# Patient Record
Sex: Male | Born: 1970
Health system: Southern US, Community
[De-identification: ages and names within clinical notes are randomized; demographics above are authoritative.]

## PROBLEM LIST (undated history)

## (undated) DIAGNOSIS — N3941 Urge incontinence: Secondary | ICD-10-CM

## (undated) DIAGNOSIS — G249 Dystonia, unspecified: Secondary | ICD-10-CM

## (undated) DIAGNOSIS — G244 Idiopathic orofacial dystonia: Secondary | ICD-10-CM

## (undated) DIAGNOSIS — R319 Hematuria, unspecified: Secondary | ICD-10-CM

## (undated) DIAGNOSIS — R7303 Prediabetes: Secondary | ICD-10-CM

## (undated) DIAGNOSIS — G4730 Sleep apnea, unspecified: Secondary | ICD-10-CM

## (undated) DIAGNOSIS — N401 Enlarged prostate with lower urinary tract symptoms: Secondary | ICD-10-CM

## (undated) DIAGNOSIS — I10 Essential (primary) hypertension: Secondary | ICD-10-CM

## (undated) DIAGNOSIS — Z973 Presence of spectacles and contact lenses: Secondary | ICD-10-CM

## (undated) DIAGNOSIS — R339 Retention of urine, unspecified: Secondary | ICD-10-CM

## (undated) DIAGNOSIS — T7840XA Allergy, unspecified, initial encounter: Secondary | ICD-10-CM

## (undated) DIAGNOSIS — M1990 Unspecified osteoarthritis, unspecified site: Secondary | ICD-10-CM

## (undated) DIAGNOSIS — J45909 Unspecified asthma, uncomplicated: Secondary | ICD-10-CM

## (undated) DIAGNOSIS — F411 Generalized anxiety disorder: Secondary | ICD-10-CM

## (undated) DIAGNOSIS — N35919 Unspecified urethral stricture, male, unspecified site: Secondary | ICD-10-CM

## (undated) HISTORY — DX: Sleep apnea, unspecified: G47.30

## (undated) HISTORY — PX: ACHILLES TENDON REPAIR: SUR1153

## (undated) HISTORY — PX: COLONOSCOPY: SHX174

## (undated) HISTORY — DX: Unspecified osteoarthritis, unspecified site: M19.90

## (undated) HISTORY — DX: Dystonia, unspecified: G24.9

## (undated) HISTORY — PX: OTHER SURGICAL HISTORY: SHX169

## (undated) HISTORY — DX: Allergy, unspecified, initial encounter: T78.40XA

---

## 1999-08-08 HISTORY — PX: ACHILLES TENDON REPAIR: SUR1153

## 2013-03-03 ENCOUNTER — Telehealth: Payer: Self-pay | Admitting: Family Medicine

## 2013-03-03 NOTE — Telephone Encounter (Addendum)
Pt would like to re-est with MD last visit 6 yrs ago. Pt is aware MD out of office

## 2013-03-14 NOTE — Telephone Encounter (Signed)
lmom for pt to sch appt with md in oct 2014

## 2013-03-14 NOTE — Telephone Encounter (Signed)
Okay to schedule per Dr Todd 

## 2013-03-18 NOTE — Telephone Encounter (Signed)
Pt returning call to follow-up on request to re-establish with Dr. Tawanna Cooler.  After reviewing chart, informed pt ok to schedule appt to re-establish, appt scheduled 05/27/13 @ 9am.

## 2013-05-27 ENCOUNTER — Encounter: Payer: Self-pay | Admitting: Family Medicine

## 2013-05-27 ENCOUNTER — Ambulatory Visit (INDEPENDENT_AMBULATORY_CARE_PROVIDER_SITE_OTHER): Payer: PRIVATE HEALTH INSURANCE | Admitting: Family Medicine

## 2013-05-27 VITALS — BP 130/90 | Temp 98.3°F | Ht 68.5 in | Wt 218.0 lb

## 2013-05-27 DIAGNOSIS — Z8042 Family history of malignant neoplasm of prostate: Secondary | ICD-10-CM

## 2013-05-27 DIAGNOSIS — Z23 Encounter for immunization: Secondary | ICD-10-CM

## 2013-05-27 DIAGNOSIS — R209 Unspecified disturbances of skin sensation: Secondary | ICD-10-CM

## 2013-05-27 DIAGNOSIS — Z2089 Contact with and (suspected) exposure to other communicable diseases: Secondary | ICD-10-CM

## 2013-05-27 DIAGNOSIS — Z202 Contact with and (suspected) exposure to infections with a predominantly sexual mode of transmission: Secondary | ICD-10-CM

## 2013-05-27 DIAGNOSIS — Z0000 Encounter for general adult medical examination without abnormal findings: Secondary | ICD-10-CM | POA: Insufficient documentation

## 2013-05-27 DIAGNOSIS — R200 Anesthesia of skin: Secondary | ICD-10-CM | POA: Insufficient documentation

## 2013-05-27 LAB — POCT URINALYSIS DIPSTICK
Bilirubin, UA: NEGATIVE
Glucose, UA: NEGATIVE
Ketones, UA: NEGATIVE
Nitrite, UA: NEGATIVE
Protein, UA: NEGATIVE
Spec Grav, UA: 1.025

## 2013-05-27 LAB — HEPATIC FUNCTION PANEL
ALT: 28 U/L (ref 0–53)
Albumin: 4.2 g/dL (ref 3.5–5.2)
Bilirubin, Direct: 0.1 mg/dL (ref 0.0–0.3)
Total Protein: 7.4 g/dL (ref 6.0–8.3)

## 2013-05-27 LAB — BASIC METABOLIC PANEL
CO2: 28 mEq/L (ref 19–32)
Creatinine, Ser: 0.9 mg/dL (ref 0.4–1.5)
GFR: 100.6 mL/min (ref >60.00)
Glucose, Bld: 90 mg/dL (ref 70–99)
Potassium: 4.1 mEq/L (ref 3.5–5.1)
Sodium: 138 mEq/L (ref 135–145)

## 2013-05-27 LAB — LIPID PANEL
Cholesterol: 210 mg/dL — ABNORMAL HIGH (ref 0–200)
HDL: 58.2 mg/dL (ref >39.00)
Total CHOL/HDL Ratio: 4
Triglycerides: 62 mg/dL (ref 0.0–149.0)
VLDL: 12.4 mg/dL (ref 0.0–40.0)

## 2013-05-27 LAB — CBC WITH DIFFERENTIAL/PLATELET
Basophils Relative: 0.4 % (ref 0.0–3.0)
Eosinophils Relative: 1.5 % (ref 0.0–5.0)
Hemoglobin: 15 g/dL (ref 13.0–17.0)
Lymphocytes Relative: 35.7 % (ref 12.0–46.0)
Monocytes Relative: 9.3 % (ref 3.0–12.0)
Neutro Abs: 2.4 10*3/uL (ref 1.4–7.7)
RBC: 4.79 Mil/uL (ref 4.22–5.81)
WBC: 4.5 10*3/uL (ref 4.5–10.5)

## 2013-05-27 MED ORDER — DOXYCYCLINE HYCLATE 100 MG PO TABS: 100.0000 mg | ORAL_TABLET | Freq: Two times a day (BID) | ORAL | Status: DC

## 2013-05-27 NOTE — Patient Instructions (Signed)
Doxycycline one twice daily till bilateral empty  We will call you when I gets her lab work back  Because of your family history prostate cancer or recommend yearly screening

## 2013-05-27 NOTE — Progress Notes (Signed)
  Subjective:    Patient ID: Sean Romero, male    DOB: [birth year], 42 y.o.   MRN: [id number]  HPI Sean Romero is a 42 year old single male nonsmoker who we last saw about 6 years ago.  He comes in today for general physical examination  He says he feels well except discuss some tingling in his right he in. It's been going on for about 3 months. No history,.  Also girlfriend was treated for Chlamydia and he was not given any medication. He's asymptomatic  No hospitalizations illnesses or injuries no med allergies does not taking medicines frank. Assessment: Drinks occasional drink of alcohol. Tetanus 2008 seasonal flu shot today  Family history noncontributory except father has a history of prostate cancer systems died at age 59. There is also a history of diabetes in his mother and his grandmother.   Review of Systems  Constitutional: Negative.   HENT: Negative.   Eyes: Negative.   Respiratory: Negative.   Cardiovascular: Negative.   Gastrointestinal: Negative.   Genitourinary: Negative.   Musculoskeletal: Negative.   Skin: Negative.   Neurological: Negative.   Psychiatric/Behavioral: Negative.   All other systems reviewed and are negative.       Objective:   Physical Exam  Constitutional: He is oriented to person, place, and time. He appears well-developed and well-nourished.  HENT:  Head: Normocephalic and atraumatic.  Right Ear: External ear normal.  Left Ear: External ear normal.  Nose: Nose normal.  Mouth/Throat: Oropharynx is clear and moist.  Eyes: Conjunctivae and EOM are normal. Pupils are equal, round, and reactive to light.  Neck: Normal range of motion. Neck supple. No JVD present. No tracheal deviation present. No thyromegaly present.  Cardiovascular: Normal rate, regular rhythm, normal heart sounds and intact distal pulses.  Exam reveals no gallop and no friction rub.   No murmur heard. Pulmonary/Chest: Effort normal and breath sounds normal. No stridor. No  respiratory distress. He has no wheezes. He has no rales. He exhibits no tenderness.  Abdominal: Soft. Bowel sounds are normal. He exhibits no distension and no mass. There is no tenderness. There is no rebound and no guarding.  Genitourinary: Rectum normal, prostate normal and penis normal. Guaiac negative stool. No penile tenderness.  Musculoskeletal: Normal range of motion. He exhibits no edema and no tenderness.  Lymphadenopathy:    He has no cervical adenopathy.  Neurological: He is alert and oriented to person, place, and time. He has normal reflexes. No cranial nerve deficit. He exhibits normal muscle tone.  Skin: Skin is warm and dry. No rash noted. No erythema. No pallor.  Psychiatric: He has a normal mood and affect. His behavior is normal. Judgment and thought content normal.          Assessment & Plan:  Healthy male  Early carpal tunnel syndrome right wrist....... splint  History of contact with chlamydia,,,,,,, doxycycline 100 twice a day for 10 days  Positive family history of prostate cancer......... screen yearly

## 2014-10-06 ENCOUNTER — Ambulatory Visit (INDEPENDENT_AMBULATORY_CARE_PROVIDER_SITE_OTHER): Payer: PRIVATE HEALTH INSURANCE | Admitting: Family Medicine

## 2014-10-06 ENCOUNTER — Encounter: Payer: Self-pay | Admitting: Family Medicine

## 2014-10-06 VITALS — BP 130/90 | Temp 98.6°F | Wt 224.0 lb

## 2014-10-06 DIAGNOSIS — R300 Dysuria: Secondary | ICD-10-CM | POA: Diagnosis not present

## 2014-10-06 DIAGNOSIS — R1030 Lower abdominal pain, unspecified: Secondary | ICD-10-CM | POA: Diagnosis not present

## 2014-10-06 DIAGNOSIS — Z8042 Family history of malignant neoplasm of prostate: Secondary | ICD-10-CM | POA: Diagnosis not present

## 2014-10-06 DIAGNOSIS — R1032 Left lower quadrant pain: Secondary | ICD-10-CM

## 2014-10-06 LAB — LIPID PANEL
Cholesterol: 212 mg/dL — ABNORMAL HIGH (ref 0–200)
HDL: 52 mg/dL (ref >39.00)
LDL CALC: 141 mg/dL — AB (ref 0–99)
NonHDL: 160
TRIGLYCERIDES: 96 mg/dL (ref 0.0–149.0)
Total CHOL/HDL Ratio: 4
VLDL: 19.2 mg/dL (ref 0.0–40.0)

## 2014-10-06 LAB — POCT URINALYSIS DIPSTICK
Bilirubin, UA: NEGATIVE
Blood, UA: NEGATIVE
GLUCOSE UA: NEGATIVE
KETONES UA: NEGATIVE
LEUKOCYTES UA: NEGATIVE
Nitrite, UA: NEGATIVE
PROTEIN UA: NEGATIVE
Urobilinogen, UA: NEGATIVE
pH, UA: 6

## 2014-10-06 LAB — CBC WITH DIFFERENTIAL/PLATELET
Basophils Absolute: 0 10*3/uL (ref 0.0–0.1)
Basophils Relative: 0.3 % (ref 0.0–3.0)
EOS ABS: 0 10*3/uL (ref 0.0–0.7)
Eosinophils Relative: 0.7 % (ref 0.0–5.0)
HCT: 44.4 % (ref 39.0–52.0)
Hemoglobin: 15 g/dL (ref 13.0–17.0)
LYMPHS PCT: 26.4 % (ref 12.0–46.0)
Lymphs Abs: 2 10*3/uL (ref 0.7–4.0)
MCHC: 33.7 g/dL (ref 30.0–36.0)
MCV: 91.7 fl (ref 78.0–100.0)
Monocytes Absolute: 0.4 10*3/uL (ref 0.1–1.0)
Monocytes Relative: 6 % (ref 3.0–12.0)
NEUTROS PCT: 66.6 % (ref 43.0–77.0)
Neutro Abs: 4.9 10*3/uL (ref 1.4–7.7)
PLATELETS: 231 10*3/uL (ref 150.0–400.0)
RBC: 4.85 Mil/uL (ref 4.22–5.81)
RDW: 14 % (ref 11.5–15.5)
WBC: 7.4 10*3/uL (ref 4.0–10.5)

## 2014-10-06 LAB — HEPATIC FUNCTION PANEL
ALT: 20 U/L (ref 0–53)
AST: 14 U/L (ref 0–37)
Albumin: 4.4 g/dL (ref 3.5–5.2)
Alkaline Phosphatase: 56 U/L (ref 39–117)
BILIRUBIN TOTAL: 0.7 mg/dL (ref 0.2–1.2)
Bilirubin, Direct: 0.1 mg/dL (ref 0.0–0.3)
Total Protein: 7.1 g/dL (ref 6.0–8.3)

## 2014-10-06 LAB — BASIC METABOLIC PANEL
BUN: 13 mg/dL (ref 6–23)
CO2: 31 meq/L (ref 19–32)
Calcium: 9.7 mg/dL (ref 8.4–10.5)
Chloride: 103 mEq/L (ref 96–112)
Creatinine, Ser: 0.96 mg/dL (ref 0.40–1.50)
GFR: 91.56 mL/min (ref >60.00)
GLUCOSE: 97 mg/dL (ref 70–99)
POTASSIUM: 3.9 meq/L (ref 3.5–5.1)
SODIUM: 138 meq/L (ref 135–145)

## 2014-10-06 LAB — TSH: TSH: 2.01 u[IU]/mL (ref 0.35–4.50)

## 2014-10-06 LAB — PSA: PSA: 0.71 ng/mL (ref 0.10–4.00)

## 2014-10-06 MED ORDER — DOXYCYCLINE HYCLATE 100 MG PO TABS: 100.0000 mg | ORAL_TABLET | Freq: Two times a day (BID) | ORAL | Status: DC

## 2014-10-06 NOTE — Patient Instructions (Signed)
Doxycycline 100 mg.....Marland Kitchen. one twice daily for 3 weeks  Labs today  Set up a 30 minute appointment mid to late April for general physical exam

## 2014-10-06 NOTE — Progress Notes (Signed)
Pre visit review using our clinic review tool, if applicable. No additional management support is needed unless otherwise documented below in the visit note. 

## 2014-10-06 NOTE — Progress Notes (Signed)
   Subjective:    Patient ID: Sean Romero, male    DOB: 05/14/1971, 44 y.o.   MRN: [id number]  HPI Sean Romero is a 44 year old married male nonsmoker who comes in today for evaluation of 3 issues  We saw him in the fall 2014. At that time his girlfriend had had chlamydia and he had not been treated. We treated with doxycycline 100 mg twice a day for 2 weeks. He said he has some burning on urination at the time which is still present. He has no fever chills or back pain.  For 2 weeks he said some soreness in his left groin. No fever chills vomiting diarrhea etc.  Also positive family history of prostate cancer recommended yearly screening. Last PSA 2014   Review of Systems Review of systems otherwise negative    Objective:   Physical Exam  Well-developed well-nourished male no acute distress vital signs stable he is afebrile examination of left groin where he feels some tenderness is normal. Normal circumcised male testes normal no hernias or masses  Urine normal      Assessment & Plan:  Symptoms of dysuria.... Maybe low-grade prostatitis......... doxycycline twice a day for 3 weeks  Positive family history of prostate cancer....... labs CPX  Left groin pain unknown etiology...............Marland Kitchen

## 2014-12-07 ENCOUNTER — Other Ambulatory Visit (INDEPENDENT_AMBULATORY_CARE_PROVIDER_SITE_OTHER): Payer: PRIVATE HEALTH INSURANCE

## 2014-12-07 DIAGNOSIS — Z0000 Encounter for general adult medical examination without abnormal findings: Secondary | ICD-10-CM | POA: Diagnosis not present

## 2014-12-07 LAB — LIPID PANEL
Cholesterol: 202 mg/dL — ABNORMAL HIGH (ref 0–200)
HDL: 60.7 mg/dL (ref >39.00)
LDL Cholesterol: 131 mg/dL — ABNORMAL HIGH (ref 0–99)
NonHDL: 141.3
Total CHOL/HDL Ratio: 3
Triglycerides: 54 mg/dL (ref 0.0–149.0)
VLDL: 10.8 mg/dL (ref 0.0–40.0)

## 2014-12-07 LAB — CBC WITH DIFFERENTIAL/PLATELET
BASOS PCT: 0.3 % (ref 0.0–3.0)
Basophils Absolute: 0 10*3/uL (ref 0.0–0.1)
Eosinophils Absolute: 0.1 10*3/uL (ref 0.0–0.7)
Eosinophils Relative: 2.6 % (ref 0.0–5.0)
HEMATOCRIT: 42.7 % (ref 39.0–52.0)
HEMOGLOBIN: 14.6 g/dL (ref 13.0–17.0)
LYMPHS PCT: 40.7 % (ref 12.0–46.0)
Lymphs Abs: 2.2 10*3/uL (ref 0.7–4.0)
MCHC: 34.3 g/dL (ref 30.0–36.0)
MCV: 90.9 fl (ref 78.0–100.0)
MONO ABS: 0.4 10*3/uL (ref 0.1–1.0)
Monocytes Relative: 8.3 % (ref 3.0–12.0)
Neutro Abs: 2.6 10*3/uL (ref 1.4–7.7)
Neutrophils Relative %: 48.1 % (ref 43.0–77.0)
Platelets: 216 10*3/uL (ref 150.0–400.0)
RBC: 4.7 Mil/uL (ref 4.22–5.81)
RDW: 14.2 % (ref 11.5–15.5)
WBC: 5.4 10*3/uL (ref 4.0–10.5)

## 2014-12-07 LAB — TSH: TSH: 1.46 u[IU]/mL (ref 0.35–4.50)

## 2014-12-07 LAB — HEPATIC FUNCTION PANEL
ALT: 21 U/L (ref 0–53)
AST: 22 U/L (ref 0–37)
Albumin: 3.8 g/dL (ref 3.5–5.2)
Alkaline Phosphatase: 57 U/L (ref 39–117)
BILIRUBIN TOTAL: 0.7 mg/dL (ref 0.2–1.2)
Bilirubin, Direct: 0.1 mg/dL (ref 0.0–0.3)
Total Protein: 6.9 g/dL (ref 6.0–8.3)

## 2014-12-07 LAB — POCT URINALYSIS DIPSTICK
Bilirubin, UA: NEGATIVE
Glucose, UA: NEGATIVE
Ketones, UA: NEGATIVE
Nitrite, UA: NEGATIVE
PROTEIN UA: NEGATIVE
Spec Grav, UA: 1.015
Urobilinogen, UA: 0.2
pH, UA: 6

## 2014-12-07 LAB — BASIC METABOLIC PANEL
BUN: 12 mg/dL (ref 6–23)
CO2: 28 mEq/L (ref 19–32)
Calcium: 9.1 mg/dL (ref 8.4–10.5)
Chloride: 104 mEq/L (ref 96–112)
Creatinine, Ser: 1.01 mg/dL (ref 0.40–1.50)
GFR: 86.28 mL/min (ref >60.00)
Glucose, Bld: 95 mg/dL (ref 70–99)
POTASSIUM: 3.8 meq/L (ref 3.5–5.1)
SODIUM: 138 meq/L (ref 135–145)

## 2014-12-16 ENCOUNTER — Encounter: Payer: Self-pay | Admitting: Family Medicine

## 2014-12-16 ENCOUNTER — Ambulatory Visit (INDEPENDENT_AMBULATORY_CARE_PROVIDER_SITE_OTHER): Payer: PRIVATE HEALTH INSURANCE | Admitting: Family Medicine

## 2014-12-16 VITALS — BP 130/90 | Temp 98.6°F | Ht 69.0 in | Wt 236.0 lb

## 2014-12-16 DIAGNOSIS — Z0000 Encounter for general adult medical examination without abnormal findings: Secondary | ICD-10-CM | POA: Diagnosis not present

## 2014-12-16 DIAGNOSIS — Z23 Encounter for immunization: Secondary | ICD-10-CM | POA: Diagnosis not present

## 2014-12-16 DIAGNOSIS — Z8042 Family history of malignant neoplasm of prostate: Secondary | ICD-10-CM

## 2014-12-16 LAB — PSA: PSA: 0.57 ng/mL (ref 0.10–4.00)

## 2014-12-16 NOTE — Progress Notes (Signed)
   Subjective:    Patient ID: Sean Romero, male    DOB: 09/26/1970, 44 y.o.   MRN: [id number]  HPI Sean Romero is a 44 year old married male nonsmoker who comes in today for general physical examination  He's always been neck from health he has no chronic health problems. He does have a positive family history of prostate cancer  He gets routine eye care, dental care, colonoscopy at age 44 because no family history of colon cancer polyps  Tetanus booster given today  Weight 236 pounds. Height 69 inches. Recommend he get down to about 2:15 her to 10   Review of Systems  Constitutional: Negative.   HENT: Negative.   Eyes: Negative.   Respiratory: Negative.   Cardiovascular: Negative.   Gastrointestinal: Negative.   Endocrine: Negative.   Genitourinary: Negative.   Musculoskeletal: Negative.   Skin: Negative.   Allergic/Immunologic: Negative.   Neurological: Negative.   Hematological: Negative.   Psychiatric/Behavioral: Negative.        Objective:   Physical Exam  Constitutional: He is oriented to person, place, and time. He appears well-developed and well-nourished.  HENT:  Head: Normocephalic and atraumatic.  Right Ear: External ear normal.  Left Ear: External ear normal.  Nose: Nose normal.  Mouth/Throat: Oropharynx is clear and moist.  Eyes: Conjunctivae and EOM are normal. Pupils are equal, round, and reactive to light.  Neck: Normal range of motion. Neck supple. No JVD present. No tracheal deviation present. No thyromegaly present.  Cardiovascular: Normal rate, regular rhythm, normal heart sounds and intact distal pulses.  Exam reveals no gallop and no friction rub.   No murmur heard. Pulmonary/Chest: Effort normal and breath sounds normal. No stridor. No respiratory distress. He has no wheezes. He has no rales. He exhibits no tenderness.  Abdominal: Soft. Bowel sounds are normal. He exhibits no distension and no mass. There is no tenderness. There is no rebound and  no guarding.  Genitourinary: Rectum normal, prostate normal and penis normal. Guaiac negative stool. No penile tenderness.  Musculoskeletal: Normal range of motion. He exhibits no edema or tenderness.  Lymphadenopathy:    He has no cervical adenopathy.  Neurological: He is alert and oriented to person, place, and time. He has normal reflexes. No cranial nerve deficit. He exhibits normal muscle tone.  Skin: Skin is warm and dry. No rash noted. No erythema. No pallor.  Psychiatric: He has a normal mood and affect. His behavior is normal. Judgment and thought content normal.  Nursing note and vitals reviewed.         Assessment & Plan:  Healthy male  Family history of prostate cancer........ Father.......... PSA yearly along with DRE yearly

## 2014-12-16 NOTE — Patient Instructions (Signed)
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

## 2014-12-16 NOTE — Progress Notes (Signed)
Pre visit review using our clinic review tool, if applicable. No additional management support is needed unless otherwise documented below in the visit note. 

## 2016-09-07 ENCOUNTER — Other Ambulatory Visit (INDEPENDENT_AMBULATORY_CARE_PROVIDER_SITE_OTHER): Payer: PRIVATE HEALTH INSURANCE

## 2016-09-07 DIAGNOSIS — Z0000 Encounter for general adult medical examination without abnormal findings: Secondary | ICD-10-CM

## 2016-09-07 LAB — BASIC METABOLIC PANEL
BUN: 17 mg/dL (ref 6–23)
CHLORIDE: 103 meq/L (ref 96–112)
CO2: 31 mEq/L (ref 19–32)
Calcium: 9.8 mg/dL (ref 8.4–10.5)
Creatinine, Ser: 1.06 mg/dL (ref 0.40–1.50)
GFR: 80.92 mL/min (ref >60.00)
GLUCOSE: 102 mg/dL — AB (ref 70–99)
POTASSIUM: 4.4 meq/L (ref 3.5–5.1)
SODIUM: 138 meq/L (ref 135–145)

## 2016-09-07 LAB — HEPATIC FUNCTION PANEL
ALBUMIN: 4.6 g/dL (ref 3.5–5.2)
ALT: 39 U/L (ref 0–53)
AST: 11 U/L (ref 0–37)
Alkaline Phosphatase: 55 U/L (ref 39–117)
Bilirubin, Direct: 0.1 mg/dL (ref 0.0–0.3)
TOTAL PROTEIN: 7 g/dL (ref 6.0–8.3)
Total Bilirubin: 0.8 mg/dL (ref 0.2–1.2)

## 2016-09-07 LAB — CBC WITH DIFFERENTIAL/PLATELET
Basophils Absolute: 0 10*3/uL (ref 0.0–0.1)
Basophils Relative: 0.5 % (ref 0.0–3.0)
EOS PCT: 1.5 % (ref 0.0–5.0)
Eosinophils Absolute: 0.1 10*3/uL (ref 0.0–0.7)
HEMATOCRIT: 45.1 % (ref 39.0–52.0)
Hemoglobin: 15.4 g/dL (ref 13.0–17.0)
LYMPHS PCT: 37.9 % (ref 12.0–46.0)
Lymphs Abs: 1.9 10*3/uL (ref 0.7–4.0)
MCHC: 34.2 g/dL (ref 30.0–36.0)
MCV: 91.6 fl (ref 78.0–100.0)
Monocytes Absolute: 0.4 10*3/uL (ref 0.1–1.0)
Monocytes Relative: 7.9 % (ref 3.0–12.0)
Neutro Abs: 2.6 10*3/uL (ref 1.4–7.7)
Neutrophils Relative %: 52.2 % (ref 43.0–77.0)
Platelets: 215 10*3/uL (ref 150.0–400.0)
RBC: 4.92 Mil/uL (ref 4.22–5.81)
RDW: 13.7 % (ref 11.5–15.5)
WBC: 5 10*3/uL (ref 4.0–10.5)

## 2016-09-07 LAB — POC URINALSYSI DIPSTICK (AUTOMATED)
Bilirubin, UA: NEGATIVE
Glucose, UA: NEGATIVE
KETONES UA: NEGATIVE
Nitrite, UA: NEGATIVE
PH UA: 5.5
PROTEIN UA: NEGATIVE
SPEC GRAV UA: 1.025
Urobilinogen, UA: 0.2

## 2016-09-07 LAB — LIPID PANEL
Cholesterol: 230 mg/dL — ABNORMAL HIGH (ref 0–200)
HDL: 56.4 mg/dL (ref >39.00)
LDL Cholesterol: 162 mg/dL — ABNORMAL HIGH (ref 0–99)
NONHDL: 173.33
Total CHOL/HDL Ratio: 4
Triglycerides: 57 mg/dL (ref 0.0–149.0)
VLDL: 11.4 mg/dL (ref 0.0–40.0)

## 2016-09-07 LAB — PSA: PSA: 0.56 ng/mL (ref 0.10–4.00)

## 2016-09-07 LAB — TSH: TSH: 1.52 u[IU]/mL (ref 0.35–4.50)

## 2016-09-11 ENCOUNTER — Telehealth: Payer: Self-pay | Admitting: Family Medicine

## 2016-09-11 ENCOUNTER — Ambulatory Visit (INDEPENDENT_AMBULATORY_CARE_PROVIDER_SITE_OTHER): Payer: PRIVATE HEALTH INSURANCE | Admitting: Family Medicine

## 2016-09-11 ENCOUNTER — Telehealth: Payer: Self-pay | Admitting: Emergency Medicine

## 2016-09-11 ENCOUNTER — Encounter: Payer: Self-pay | Admitting: Family Medicine

## 2016-09-11 VITALS — BP 138/100 | HR 97 | Temp 98.3°F | Ht 68.0 in | Wt 236.1 lb

## 2016-09-11 DIAGNOSIS — R739 Hyperglycemia, unspecified: Secondary | ICD-10-CM | POA: Diagnosis not present

## 2016-09-11 DIAGNOSIS — Z0000 Encounter for general adult medical examination without abnormal findings: Secondary | ICD-10-CM

## 2016-09-11 DIAGNOSIS — Z8042 Family history of malignant neoplasm of prostate: Secondary | ICD-10-CM

## 2016-09-11 DIAGNOSIS — Z23 Encounter for immunization: Secondary | ICD-10-CM | POA: Diagnosis not present

## 2016-09-11 DIAGNOSIS — N401 Enlarged prostate with lower urinary tract symptoms: Secondary | ICD-10-CM | POA: Diagnosis not present

## 2016-09-11 DIAGNOSIS — E7889 Other lipoprotein metabolism disorders: Secondary | ICD-10-CM

## 2016-09-11 DIAGNOSIS — E785 Hyperlipidemia, unspecified: Secondary | ICD-10-CM | POA: Insufficient documentation

## 2016-09-11 DIAGNOSIS — R351 Nocturia: Secondary | ICD-10-CM

## 2016-09-11 MED ORDER — TAMSULOSIN HCL 0.4 MG PO CAPS: 0.4000 mg | ORAL_CAPSULE | Freq: Every day | ORAL | 4 refills | Status: DC

## 2016-09-11 NOTE — Telephone Encounter (Signed)
Called rx into pharmacy.

## 2016-09-11 NOTE — Patient Instructions (Addendum)
Carbohydrate free diet........... high fructose corn syrup..........  Walk 30 minutes daily  Fasting labs in 3 months  Flomax..........Marland Kitchen. 1 tablet at bedtime  Omron pump up digital blood pressure cuff.......... check your blood pressure daily in the morning....... in addition to sugar-free no salt  Return the second week in May for follow-up... When you return bring a record of all your blood pressure readings and the device  Fasting labs the first week in May

## 2016-09-11 NOTE — Telephone Encounter (Signed)
Changed pt pharmacy

## 2016-09-11 NOTE — Telephone Encounter (Signed)
[facility]'s [facility]/ [address], [place], [place] [id number]

## 2016-09-11 NOTE — Progress Notes (Signed)
Sean Romero is a 46 year old married male nonsmoker who comes in today for general physical examination  He's always been [facility] he said no chronic health problems except he struggled with his weight. BMI 34.9 no obese class I.  The last 6 months she's having difficulty urinating. Stream is good he's having some dribbling.Marland Kitchen. Positive family history of prostate cancer's father died from the disease.  Fasting blood sugar 102 which goes along with his increase in weight.  Recommend annual eye exam since his grandmother had glaucoma, regular dental care, colonoscopy not until age 46. No family history of colon cancer polyps  Immunizations tetanus May 2016 seasonal flu shot today.  He does all the cooking at home.  Vaccinations up-to-date except he needs a flu shot which will be given today  Family history pertinent father died of prostate cancer mother's live and well. Mother and grandmother both her glucose intolerant  14 point review of systems reviewed and otherwise negative  BP (!) 138/100 (BP Location: Right Arm, Patient Position: Sitting, Cuff Size: Normal)   Pulse 97   Temp 98.3 F (36.8 C) (Oral)   Ht 5\' 8"  (1.727 m)   Wt 236 lb 1.6 oz (107.1 kg)   BMI 35.90 kg/m  Examination HEENT were negative except cerumen impaction on her what right which relieved by irrigation. Neck was supple no carotid bruits cardiopulmonary exam normal abdominal exam normal genitalia normal circumcised male rectum normal stool guaiac-negative prostate normal extremities normal skin normal peripheral pulses normal  #1 overweight........ outlined diet exercise and weight loss program  #2 BPH.......... Flomax 0.4 daily at bedtime  #3 glucose intolerance secondary to #1.......... diet exercise and weight loss follow-up blood sugar and A1c in 6 months  #4 abnormal lipids,,,,,,, recheck lipid panel after weight loss and diet  #5 elevated blood pressure

## 2016-09-11 NOTE — Telephone Encounter (Signed)
Pt unable to pick up Rx [facility] because their escribe is not working. Pt would like you to resend [facility],but they suggest we phone in rx.  tamsulosin (FLOMAX) 0.4 MG CAPS capsule

## 2016-09-12 NOTE — Telephone Encounter (Signed)
Called rx into the pharmacy

## 2016-12-14 ENCOUNTER — Other Ambulatory Visit (INDEPENDENT_AMBULATORY_CARE_PROVIDER_SITE_OTHER): Payer: PRIVATE HEALTH INSURANCE

## 2016-12-14 DIAGNOSIS — R739 Hyperglycemia, unspecified: Secondary | ICD-10-CM | POA: Diagnosis not present

## 2016-12-14 DIAGNOSIS — E7889 Other lipoprotein metabolism disorders: Secondary | ICD-10-CM

## 2016-12-14 LAB — LIPID PANEL
Cholesterol: 220 mg/dL — ABNORMAL HIGH (ref 0–200)
HDL: 58 mg/dL (ref >39.00)
LDL Cholesterol: 148 mg/dL — ABNORMAL HIGH (ref 0–99)
NONHDL: 161.76
Total CHOL/HDL Ratio: 4
Triglycerides: 67 mg/dL (ref 0.0–149.0)
VLDL: 13.4 mg/dL (ref 0.0–40.0)

## 2016-12-14 LAB — BASIC METABOLIC PANEL
BUN: 17 mg/dL (ref 6–23)
CALCIUM: 9.4 mg/dL (ref 8.4–10.5)
CO2: 29 mEq/L (ref 19–32)
Chloride: 104 mEq/L (ref 96–112)
Creatinine, Ser: 1.06 mg/dL (ref 0.40–1.50)
GFR: 80.82 mL/min (ref >60.00)
GLUCOSE: 101 mg/dL — AB (ref 70–99)
Potassium: 4.2 mEq/L (ref 3.5–5.1)
Sodium: 141 mEq/L (ref 135–145)

## 2016-12-14 LAB — HEMOGLOBIN A1C: Hgb A1c MFr Bld: 6 % (ref 4.6–6.5)

## 2016-12-18 ENCOUNTER — Ambulatory Visit (INDEPENDENT_AMBULATORY_CARE_PROVIDER_SITE_OTHER): Payer: PRIVATE HEALTH INSURANCE | Admitting: Family Medicine

## 2016-12-18 ENCOUNTER — Encounter: Payer: Self-pay | Admitting: Family Medicine

## 2016-12-18 VITALS — BP 116/84 | Temp 98.0°F | Wt 232.0 lb

## 2016-12-18 DIAGNOSIS — E7889 Other lipoprotein metabolism disorders: Secondary | ICD-10-CM

## 2016-12-18 MED ORDER — ATORVASTATIN CALCIUM 10 MG PO TABS: 10.0000 mg | ORAL_TABLET | Freq: Every day | ORAL | 3 refills | Status: DC

## 2016-12-18 NOTE — Patient Instructions (Signed)
Lipitor 10 mg......... one daily at bedtime along with an aspirin tablet  Return the second week in September for follow-up labs  See me the third week in September for follow-up  1 sure leg heals up work hard in the diet exercise and weight loss

## 2016-12-18 NOTE — Progress Notes (Signed)
Raymonds a 46 year old married male nonsmoker who comes in today for follow-up  We saw him 4 months ago that time his lipids were slightly elevated his blood pressure was slightly elevated his blood sugar was 101. We started him on a diet and exercise program he comes in today for follow-up. He's lost 4 pounds. He would have lost morbidly to her quadriceps playing basketball. A1c is normal 6.0%  Blood pressure normalized 116/84  Lipids show an LDL of 148. Would like to get him below 75 with his metabolic syndrome  BP 116/84 (BP Location: Right Arm)   Temp 98 F (36.7 C) (Oral)   Wt 232 lb (105.2 kg)   BMI 35.28 kg/m  Jellies well-developed well-nourished male no acute distress  Impression #1 hyperlipidemia..........Marland Kitchen. Lipitor 10 mg daily at bedtime along with an aspirin tablet...Marland Kitchen.Marland Kitchen.Marland Kitchen. follow-up lipid panel in September  #2 overweight............... continue diet exercise and weight loss program follow-up in September.

## 2017-02-15 ENCOUNTER — Other Ambulatory Visit: Payer: Self-pay | Admitting: Family Medicine

## 2017-02-15 NOTE — Telephone Encounter (Signed)
Filled for 1 year on 12/18/16.  Message sent to the pharmacy to check file.

## 2017-08-13 ENCOUNTER — Ambulatory Visit (INDEPENDENT_AMBULATORY_CARE_PROVIDER_SITE_OTHER): Payer: PRIVATE HEALTH INSURANCE | Admitting: Family Medicine

## 2017-08-13 ENCOUNTER — Encounter: Payer: Self-pay | Admitting: Family Medicine

## 2017-08-13 VITALS — BP 132/88 | HR 86 | Temp 98.4°F | Wt 239.0 lb

## 2017-08-13 DIAGNOSIS — J301 Allergic rhinitis due to pollen: Secondary | ICD-10-CM | POA: Diagnosis not present

## 2017-08-13 DIAGNOSIS — M778 Other enthesopathies, not elsewhere classified: Secondary | ICD-10-CM

## 2017-08-13 DIAGNOSIS — R351 Nocturia: Secondary | ICD-10-CM

## 2017-08-13 DIAGNOSIS — R0683 Snoring: Secondary | ICD-10-CM

## 2017-08-13 DIAGNOSIS — N401 Enlarged prostate with lower urinary tract symptoms: Secondary | ICD-10-CM | POA: Diagnosis not present

## 2017-08-13 MED ORDER — TAMSULOSIN HCL 0.4 MG PO CAPS: 0.4000 mg | ORAL_CAPSULE | Freq: Every day | ORAL | 4 refills | Status: DC

## 2017-08-13 NOTE — Progress Notes (Signed)
Sean Romero is a 47 year old married male nonsmoker who comes in today accompanied by his wife for evaluation of multiple issues  Her main concern is the fact over the last 6-8 months she's noticed excessive snoring and at times during the night she feels he stops breathing. He's gained 6 pounds in the past 6 months. Weight is now 239 pounds.  He's also had problems with pain in his left elbow. Played golf over the weekend it made it worse. The pain in his left elbow is been present for about a month  He also has a history of allergic rhinitis postnasal drip which seems be worse in the winter  He also has history of BPH. We put him on Flomax year ago he's not taking it. When he did take it it did help  BP 132/88 (BP Location: Left Arm, Patient Position: Sitting, Cuff Size: Normal)   Pulse 86   Temp 98.4 F (36.9 C) (Oral)   Wt 239 lb (108.4 kg)   BMI 36.34 kg/m  Well-developed well-nourished overweight male no acute distress examination HEENT was pertinent he has a large tongue and a small airway. Nasal exam shows significant edema of all turbinates septum in the midline neck was supple lungs are clear  Examination left elbow shows it on the tendon consistent with tennis elbow  #1 obesity........... discussed diet exercise and weight loss  #2 allergic rhinitis........Marland Kitchen. plain Claritin and steroid nasal spray daily at bedtime  #3 question sleep apnea......... pulmonary consult  #4 BPH........Marland Kitchen. restart Flomax and take it daily instead of when necessary  #5 tennis elbow left,,,,,,,,,,,,,,,,, Motrin ice and splint,

## 2017-08-13 NOTE — Patient Instructions (Signed)
Weight loss............... carbohydrate free diet and walk 30 minutes daily  Allergic rhinitis.......Marland Kitchen. plain Claritin 10 mg 1 at bedtime........ steroid nasal spray one shot up each nostril twice daily  For the tendinitis......... Splint.......Marland Kitchen. Motrin 600 mg twice daily with food.......... ice 15 minutes 3 or 4 times daily  BPH...........Marland Kitchen. restart Flomax one daily at bedtime  We will set up a pulmonary consult ASAP to evaluate the possibility of sleep apnea

## 2017-09-07 DIAGNOSIS — G4733 Obstructive sleep apnea (adult) (pediatric): Secondary | ICD-10-CM

## 2017-09-07 HISTORY — DX: Obstructive sleep apnea (adult) (pediatric): G47.33

## 2017-09-19 ENCOUNTER — Encounter: Payer: Self-pay | Admitting: Pulmonary Disease

## 2017-09-19 ENCOUNTER — Ambulatory Visit (INDEPENDENT_AMBULATORY_CARE_PROVIDER_SITE_OTHER): Payer: PRIVATE HEALTH INSURANCE | Admitting: Pulmonary Disease

## 2017-09-19 VITALS — BP 128/80 | HR 92 | Ht 68.0 in | Wt 232.0 lb

## 2017-09-19 DIAGNOSIS — J301 Allergic rhinitis due to pollen: Secondary | ICD-10-CM

## 2017-09-19 DIAGNOSIS — G4733 Obstructive sleep apnea (adult) (pediatric): Secondary | ICD-10-CM

## 2017-09-19 DIAGNOSIS — R0683 Snoring: Secondary | ICD-10-CM | POA: Diagnosis not present

## 2017-09-19 NOTE — Patient Instructions (Signed)
Home sleep study will be scheduled. We discussed treatment options including CPAP and dental device

## 2017-09-19 NOTE — Progress Notes (Signed)
Subjective:    Patient ID: Sean Romero, male    DOB: [birth year], 47 y.o.   MRN: [id number]  HPI  47 year old man presents for evaluation of loud snoring and non-refreshing sleep. His wife of 4 years has complained that he snores fairly loudly and of late has she has noticed him stopping breathing in her sleep.  Sleep history is confounded by the fact that he is a 69-month-old daughter who occasionally sleeps with them.  Epworth sleepiness score is 4 and he reports sleepiness while watching TV or lying down to rest in the afternoon.  He seldom takes a week and naps and these are refreshing. Bedtime is between 9 and 10 PM, sleep latency is 20 minutes, he sleeps on his side with 2 pillows, reports 1-2 nocturnal awakenings including nocturia and is out of bed by 6 AM feeling rested about 70% of the time with dryness of mouth but denies headaches. There is no history suggestive of cataplexy, sleep paralysis or parasomnias  He has gained about 20 pounds to his current weight of 232 pounds  He has 2 cups of tea in the morning, denies excessive caffeinated beverages.  He has mild intermittent asthma and uses albuterol when he has a flareup about once a year.  He has hyperlipidemia that is controlled with Lipitor.  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  He lives with his wife, no smoking, drinks occasionally.  Grandmother had lung cancer, an aunt has OSA  No past medical history on file.  No past surgical history on file.  Social History   Socioeconomic History  . Marital status: Single    Spouse name: Not on file  . Number of children: Not on file  . Years of education: Not on file  . Highest education level: Not on file  Social Needs  . Financial resource strain: Not on file  . Food insecurity - worry: Not on file  . Food insecurity - inability: Not on file    . Transportation needs - medical: Not on file  . Transportation needs - non-medical: Not on file  Occupational History  . Not on file  Tobacco Use  . Smoking status: Never Smoker  . Smokeless tobacco: Never Used  Substance and Sexual Activity  . Alcohol use: Yes    Comment: ocassionally  . Drug use: No  . Sexual activity: Not on file  Other Topics Concern  . Not on file  Social History Narrative  . Not on file     Family History  Problem Relation Age of Onset  . Cancer Father      Review of Systems Positive for weight gain  Constitutional: negative for anorexia, fevers and sweats  Eyes: negative for irritation, redness and visual disturbance  Ears, nose, mouth, throat, and face: negative for earaches, epistaxis, nasal congestion and sore throat  Respiratory: negative for cough, dyspnea on exertion, sputum and wheezing  Cardiovascular: negative for chest pain, dyspnea, lower extremity edema, orthopnea, palpitations and syncope  Gastrointestinal: negative for abdominal pain, constipation, diarrhea, melena, nausea and vomiting  Genitourinary:negative for dysuria, frequency and hematuria  Hematologic/lymphatic: negative for bleeding, easy bruising and lymphadenopathy  Musculoskeletal:negative for arthralgias, muscle weakness and stiff joints  Neurological: negative for coordination problems, gait problems, headaches and weakness  Endocrine: negative for diabetic symptoms including polydipsia, polyuria and weight loss     Objective:  Physical Exam  Gen. Pleasant, obese, in no distress, normal affect ENT - large tonsils, no post nasal drip, class 2 airway Neck: No JVD, no thyromegaly, no carotid bruits Lungs: no use of accessory muscles, no dullness to percussion, decreased without rales or rhonchi  Cardiovascular: Rhythm regular, heart sounds  normal, no murmurs or gallops, no peripheral edema Abdomen: soft and non-tender, no hepatosplenomegaly, BS  normal. Musculoskeletal: No deformities, no cyanosis or clubbing Neuro:  alert, non focal, no tremors       Assessment & Plan:

## 2017-09-19 NOTE — Assessment & Plan Note (Signed)
Given excessive daytime somnolence, narrow pharyngeal exam, witnessed apneas & loud snoring, obstructive sleep apnea is very likely & an overnight polysomnogram will be scheduled as a home study. The pathophysiology of obstructive sleep apnea , it's cardiovascular consequences & modes of treatment including CPAP were discused with the patient in detail & they evidenced understanding.  Pretest probability is intermediate  

## 2017-09-19 NOTE — Assessment & Plan Note (Signed)
Take antihistaminic during spring and fall. He only has mild intermittent asthma and can take albuterol as needed

## 2017-09-27 DIAGNOSIS — G4733 Obstructive sleep apnea (adult) (pediatric): Secondary | ICD-10-CM | POA: Diagnosis not present

## 2017-10-02 ENCOUNTER — Other Ambulatory Visit: Payer: Self-pay | Admitting: *Deleted

## 2017-10-02 DIAGNOSIS — G4733 Obstructive sleep apnea (adult) (pediatric): Secondary | ICD-10-CM

## 2017-10-04 DIAGNOSIS — G4733 Obstructive sleep apnea (adult) (pediatric): Secondary | ICD-10-CM | POA: Diagnosis not present

## 2017-10-08 ENCOUNTER — Telehealth: Payer: Self-pay | Admitting: Pulmonary Disease

## 2017-10-08 DIAGNOSIS — G4733 Obstructive sleep apnea (adult) (pediatric): Secondary | ICD-10-CM

## 2017-10-08 NOTE — Telephone Encounter (Signed)
Per RA, HST showed mild OSA. Suggests trial of auto cpap 5-15cm since very symptomatic. OV in 6 weeks.

## 2017-10-08 NOTE — Telephone Encounter (Signed)
Spoke with patient. Since the study showed mild OSA, patient wanted to know how many apneas he had per hour. Advised patient that the study had not been scanned in yet, but once it has been, I would call him back to make him aware. He verbalized understanding.   Will wait for study to be scanned into patient's chart.

## 2017-10-16 NOTE — Telephone Encounter (Signed)
Left message for patient to call back for results.  

## 2017-10-18 NOTE — Telephone Encounter (Signed)
Pt is calling back  [phone]  or [phone]

## 2017-10-18 NOTE — Telephone Encounter (Signed)
Spoke with patient. He is ok with the CPAP machine. Will go ahead and order CPAP.

## 2017-12-25 ENCOUNTER — Telehealth: Payer: Self-pay | Admitting: Pulmonary Disease

## 2017-12-25 NOTE — Telephone Encounter (Signed)
Sean Romero     12/25/17 2:14 PM  Note    I called [facility] which is who it was sent to verified with Herbert Seta that they have been trying to reach him since March and he has not called back but stated they will reach out again today       lmtcb x1 for pt.

## 2017-12-25 NOTE — Telephone Encounter (Signed)
Attempted to call pt. I did not receive an answer. I have left a message for pt to return our call.  

## 2017-12-25 NOTE — Telephone Encounter (Signed)
Called and spoke to patient. Patient stated he was told his CPAP order would be placed and he never heard anything else.  I see that the order was submitted to [facility] on 10/18/17.    Sean Romero I see you worked on this, can you help? It shows as closed.

## 2017-12-25 NOTE — Telephone Encounter (Signed)
I called [facility] which is who it was sent to verified with Herbert Seta that they have been trying to reach him since March and he has not called back but stated they will reach out again today

## 2017-12-25 NOTE — Telephone Encounter (Signed)
Pt is calling back [phone]

## 2017-12-25 NOTE — Telephone Encounter (Signed)
Spoke with pt. He is aware that he needs to contact [facility] about his CPAP order. Nothing further was needed.

## 2017-12-25 NOTE — Telephone Encounter (Signed)
I have left a detailed message on the pt's named voicemail letting him know about Chan's conversation with [facility]. Nothing further was needed.

## 2017-12-25 NOTE — Telephone Encounter (Signed)
Patient returned call, CB is [phone]

## 2018-05-13 ENCOUNTER — Emergency Department (HOSPITAL_COMMUNITY)
Admission: EM | Admit: 2018-05-13 | Discharge: 2018-05-13 | Disposition: A | Discharge status: Home / Self Care | Payer: Worker's Compensation | Attending: Emergency Medicine | Admitting: Emergency Medicine

## 2018-05-13 ENCOUNTER — Other Ambulatory Visit: Payer: Self-pay

## 2018-05-13 ENCOUNTER — Encounter (HOSPITAL_COMMUNITY): Payer: Self-pay | Admitting: Emergency Medicine

## 2018-05-13 DIAGNOSIS — Y990 Civilian activity done for income or pay: Secondary | ICD-10-CM | POA: Insufficient documentation

## 2018-05-13 DIAGNOSIS — W269XXA Contact with unspecified sharp object(s), initial encounter: Secondary | ICD-10-CM | POA: Diagnosis not present

## 2018-05-13 DIAGNOSIS — I10 Essential (primary) hypertension: Secondary | ICD-10-CM | POA: Insufficient documentation

## 2018-05-13 DIAGNOSIS — Y929 Unspecified place or not applicable: Secondary | ICD-10-CM | POA: Diagnosis not present

## 2018-05-13 DIAGNOSIS — Z23 Encounter for immunization: Secondary | ICD-10-CM | POA: Diagnosis not present

## 2018-05-13 DIAGNOSIS — Z79899 Other long term (current) drug therapy: Secondary | ICD-10-CM | POA: Insufficient documentation

## 2018-05-13 DIAGNOSIS — Y9389 Activity, other specified: Secondary | ICD-10-CM | POA: Diagnosis not present

## 2018-05-13 DIAGNOSIS — S61512A Laceration without foreign body of left wrist, initial encounter: Secondary | ICD-10-CM

## 2018-05-13 HISTORY — DX: Essential (primary) hypertension: I10

## 2018-05-13 MED ORDER — LIDOCAINE HCL (PF) 1 % IJ SOLN
5.0000 mL | Freq: Once | INTRAMUSCULAR | Status: AC
  Administered 2018-05-13: 5 mL
  Filled 2018-05-13: qty 5

## 2018-05-13 MED ORDER — TETANUS-DIPHTH-ACELL PERTUSSIS 5-2.5-18.5 LF-MCG/0.5 IM SUSP
0.5000 mL | Freq: Once | INTRAMUSCULAR | Status: AC
  Administered 2018-05-13: 0.5 mL via INTRAMUSCULAR
  Filled 2018-05-13: qty 0.5

## 2018-05-13 NOTE — Discharge Instructions (Addendum)
Your evaluated in the emergency department for a laceration to your left wrist.  There was no evidence that it involved any of the tendons.  We placed 3 sutures and that will need to be removed in 10 to 12 days.  Watch for signs of infection-redness swelling pain.  You may use soap and water to the area.  Keep covered for today and then probably will only need some bacitracin and a Band-Aid over the area.

## 2018-05-13 NOTE — ED Triage Notes (Signed)
Onset today took a metal sheet of a shelf and metal cut patient's left wrist. Bleeding controlled able to move all fingers equally.

## 2018-05-13 NOTE — ED Provider Notes (Signed)
[facility] EMERGENCY DEPARTMENT Provider Note   CSN: [id number] Arrival date & time: 05/13/18  4098     History   Chief Complaint Chief Complaint  Patient presents with  . Laceration    HPI Sean Romero is a 47 y.o. male.  Right-hand-dominant complaining of a laceration to his left wrist that he sustained at work about an hour ago.  He was moving a piece of sheet metal and it slid down his arm and cut his wrist.  He is unclear about his last tetanus shot.  No numbness no weakness.  No other injuries or complaints.  Pain is sharp and worse with any movement.  Moderate in intensity.  The history is provided by the patient.  Laceration   The incident occurred 1 to 2 hours ago. The laceration is located on the left arm. The laceration is 3 cm in size. The laceration mechanism was a a metal edge. The pain is moderate. The pain has been constant since onset. He reports no foreign bodies present. His tetanus status is unknown.    Past Medical History:  Diagnosis Date  . Hypertension     Patient Active Problem List   Diagnosis Date Noted  . Tendinitis of both elbows 08/13/2017  . Allergic rhinitis due to pollen 08/13/2017  . Snoring 08/13/2017  . Lipids abnormal 09/11/2016  . BPH associated with nocturia 09/11/2016  . Family history of prostate cancer 10/06/2014  . Routine general medical examination at a health care facility 05/27/2013    History reviewed. No pertinent surgical history.      Home Medications    Prior to Admission medications   Medication Sig Start Date End Date Taking? Authorizing Provider  atorvastatin (LIPITOR) 10 MG tablet Take 1 tablet (10 mg total) by mouth daily. 12/18/16   Roderick Pee, MD  tamsulosin (FLOMAX) 0.4 MG CAPS capsule Take 1 capsule (0.4 mg total) by mouth daily. 08/13/17   Roderick Pee, MD    Family History Family History  Problem Relation Age of Onset  . Cancer Father     Social History Social History     Tobacco Use  . Smoking status: Never Smoker  . Smokeless tobacco: Never Used  Substance Use Topics  . Alcohol use: Yes    Comment: ocassionally  . Drug use: No     Allergies   Penicillins   Review of Systems Review of Systems  Skin: Positive for wound.  Neurological: Negative for weakness and numbness.     Physical Exam Updated Vital Signs BP 133/81   Pulse 80   Temp 99.1 F (37.3 C) (Oral)   Resp 16   Ht 5' 9.5" (1.765 m)   Wt 102.1 kg   SpO2 98%   BMI 32.75 kg/m   Physical Exam  Constitutional: He appears well-developed and well-nourished.  HENT:  Head: Normocephalic and atraumatic.  Eyes: Conjunctivae are normal.  Neck: Neck supple.  Pulmonary/Chest: Effort normal.  Musculoskeletal: He exhibits tenderness. He exhibits no deformity.  Approximately 3 cm superficial laceration at the crease of his left wrist.  It does not fully go through the dermis into the fat.  He is got normal flexion extension of all tendons and intact radial pulse and cap refill.  Sensory to light touch intact.  Neurological: He is alert. GCS eye subscore is 4. GCS verbal subscore is 5. GCS motor subscore is 6.  Skin: Skin is warm and dry.  Psychiatric: He has a normal mood  and affect.  Nursing note and vitals reviewed.    ED Treatments / Results  Labs (all labs ordered are listed, but only abnormal results are displayed) Labs Reviewed - No data to display  EKG None  Radiology No results found.  Procedures .Marland KitchenLaceration Repair Date/Time: 05/13/2018 10:56 AM Performed by: Terrilee Files, MD Authorized by: Terrilee Files, MD   Consent:    Consent obtained:  Verbal   Consent given by:  Patient   Risks discussed:  Infection, pain, poor cosmetic result, poor wound healing and retained foreign body   Alternatives discussed:  No treatment and delayed treatment Anesthesia (see MAR for exact dosages):    Anesthesia method:  Local infiltration   Local anesthetic:   Lidocaine 1% w/o epi Laceration details:    Location:  Hand   Hand location:  L wrist   Length (cm):  3 Repair type:    Repair type:  Simple Pre-procedure details:    Preparation:  Patient was prepped and draped in usual sterile fashion Treatment:    Area cleansed with:  Saline   Amount of cleaning:  Standard   Visualized foreign bodies/material removed: no   Skin repair:    Repair method:  Sutures   Suture size:  5-0   Suture material:  Nylon   Suture technique:  Simple interrupted   Number of sutures:  3 Approximation:    Approximation:  Close Post-procedure details:    Dressing:  Sterile dressing   Patient tolerance of procedure:  Tolerated well, no immediate complications   (including critical care time)  Medications Ordered in ED Medications - No data to display   Initial Impression / Assessment and Plan / ED Course  I have reviewed the triage vital signs and the nursing notes.  Pertinent labs & imaging results that were available during my care of the patient were reviewed by me and considered in my medical decision making (see chart for details).      Final Clinical Impressions(s) / ED Diagnoses   Final diagnoses:  Laceration of left wrist, initial encounter    ED Discharge Orders    None       Terrilee Files, MD 05/14/18 1445

## 2018-05-28 ENCOUNTER — Emergency Department (HOSPITAL_COMMUNITY): Payer: PRIVATE HEALTH INSURANCE

## 2018-05-28 ENCOUNTER — Other Ambulatory Visit: Payer: Self-pay

## 2018-05-28 ENCOUNTER — Encounter (HOSPITAL_COMMUNITY): Payer: Self-pay | Admitting: Emergency Medicine

## 2018-05-28 ENCOUNTER — Emergency Department (HOSPITAL_COMMUNITY)
Admission: EM | Admit: 2018-05-28 | Discharge: 2018-05-28 | Disposition: A | Discharge status: Home / Self Care | Payer: PRIVATE HEALTH INSURANCE | Attending: Emergency Medicine | Admitting: Emergency Medicine

## 2018-05-28 DIAGNOSIS — M25511 Pain in right shoulder: Secondary | ICD-10-CM | POA: Diagnosis not present

## 2018-05-28 HISTORY — DX: Unspecified asthma, uncomplicated: J45.909

## 2018-05-28 MED ORDER — METHYLPREDNISOLONE 4 MG PO TBPK: ORAL_TABLET | ORAL | 0 refills | Status: DC

## 2018-05-28 MED ORDER — OXYCODONE HCL 5 MG PO TABS: 2.5000 mg | ORAL_TABLET | Freq: Four times a day (QID) | ORAL | 0 refills | Status: DC | PRN

## 2018-05-28 MED ORDER — OXYCODONE-ACETAMINOPHEN 5-325 MG PO TABS
1.0000 | ORAL_TABLET | Freq: Once | ORAL | Status: AC
  Administered 2018-05-28: 1 via ORAL
  Filled 2018-05-28: qty 1

## 2018-05-28 MED ORDER — ONDANSETRON 4 MG PO TBDP
4.0000 mg | ORAL_TABLET | Freq: Once | ORAL | Status: AC
  Administered 2018-05-28: 4 mg via ORAL
  Filled 2018-05-28: qty 1

## 2018-05-28 NOTE — Discharge Instructions (Signed)
Contact a health care provider if: °Your pain increases, and medicine does not help. °Your joint pain does not improve within 3 days. °You have increased bruising or swelling. °You have a fever. °You lose 10 lb (4.5 kg) or more without trying. °Get help right away if: °You are not able to move the joint. °Your fingers or toes become numb or they turn cold and blue. °

## 2018-05-28 NOTE — ED Provider Notes (Signed)
Patient placed in Quick Look pathway, seen and evaluated   Chief Complaint: Right shoulder pain  HPI:   Sean Romero is a 47 y.o. male who presents to the emergency department for evaluation of worsening right shoulder pain.  He has been treated with NSAIDs and muscle relaxers without improvement saw a chiropractor yesterday and an orthopedist at [facility] today, they performed x-rays of the shoulder which showed no evidence of injury and recommended he come to the emergency department for further evaluation with concern for possible infection or patient is unsure what else they were worried about.  ROS: + Shoulder pain. -Chest pain, shortness of breath Physical Exam:   Gen: No distress  Neuro: Awake and Alert  Skin: Warm    Focused Exam: Palpation over the anterior and posterior aspects of the right shoulder patient also has some tenderness over the right paraspinal muscles.  No obvious swelling, erythema or warmth.  2+ radial pulse and 5/5 strength, normal sensation   Initiation of care has begun. The patient has been counseled on the process, plan, and necessity for staying for the completion/evaluation, and the remainder of the medical screening examination    Legrand Rams 05/28/18 1934    Mesner, Barbara Cower, MD 05/28/18 [phone]

## 2018-05-28 NOTE — ED Notes (Signed)
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

## 2018-05-28 NOTE — ED Triage Notes (Signed)
Pt c/o right shoulder blade pain that started Sunday, denies injury or trauma. Given NSAIDS and muscle relaxers wihout change. States he saw his chiropractor Monday, was referred to an orthopedic doctor who he saw today, pt states ortho sent him to ED. Denies chest pain/shortness of breath.

## 2018-05-28 NOTE — ED Provider Notes (Signed)
[facility] EMERGENCY DEPARTMENT Provider Note   CSN: [id number] Arrival date & time: 05/28/18  4782     History   Chief Complaint Chief Complaint  Patient presents with  . Shoulder Pain    HPI Sean Romero is a 47 y.o. male who presents the emergency department chief complaint of shoulder pain.  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  He states that he developed some pain in his right shoulder blade and that pain worsened and he feels that in the supraclavicular region.  He denies any numbness or tingling in his denies weakness.  He rates the pain as severe, 10 out of 10.  Patient went to an urgent care and was given Toradol shot and started on an anti-inflammatory.  He had no relief in his pain.  He saw a chiropractor this morning who told him that this was not a chiropractic issue and sent him directly over to see a physician at [facility].  The patient states that the orthopod saw him and told him that he was not sure what was going on but he was concerned that there might be something going on in his chest cavity that needed further evaluation or he may need a CT of his neck.  Patient denies any injuries to the area.  He has pain that is localized to his sternal clavicular joint on the right.  He has pain when he moves his arm especially with overhead movement.  He denies chest pain, shortness of breath, hemoptysis, unilateral leg swelling.  HPI  Past Medical History:  Diagnosis Date  . Asthma   . Hypertension     Patient Active Problem List   Diagnosis Date Noted  . Tendinitis of both elbows 08/13/2017  . Allergic rhinitis due to pollen 08/13/2017  . Snoring 08/13/2017  . Lipids abnormal 09/11/2016  . BPH associated with nocturia 09/11/2016  . Family history of prostate cancer 10/06/2014  . Routine general medical examination at a health care facility 05/27/2013    History  reviewed. No pertinent surgical history.      Home Medications    Prior to Admission medications   Medication Sig Start Date End Date Taking? Authorizing Provider  atorvastatin (LIPITOR) 10 MG tablet Take 1 tablet (10 mg total) by mouth daily. 12/18/16   Roderick Pee, MD  methylPREDNISolone (MEDROL DOSEPAK) 4 MG TBPK tablet Use as directed 05/28/18   Arthor Captain, PA-C  oxyCODONE (ROXICODONE) 5 MG immediate release tablet Take 0.5-1 tablets (2.5-5 mg total) by mouth every 6 (six) hours as needed for severe pain. 05/28/18   Arthor Captain, PA-C  tamsulosin (FLOMAX) 0.4 MG CAPS capsule Take 1 capsule (0.4 mg total) by mouth daily. 08/13/17   Roderick Pee, MD    Family History Family History  Problem Relation Age of Onset  . Cancer Father     Social History Social History   Tobacco Use  . Smoking status: Never Smoker  . Smokeless tobacco: Never Used  Substance Use Topics  . Alcohol use: Yes    Comment: ocassionally  . Drug use: No     Allergies   Penicillins   Review of Systems Review of Systems  Ten systems reviewed and are negative for acute change, except as noted in the HPI.   Physical Exam Updated Vital Signs BP 121/79 (BP Location: Right Arm)   Pulse 95   Temp 98.5 F (36.9 C) (  Oral)   Resp 18   SpO2 96%   Physical Exam  Constitutional: He appears well-developed and well-nourished. No distress.  HENT:  Head: Normocephalic and atraumatic.  Eyes: Conjunctivae are normal. No scleral icterus.  Neck: Normal range of motion. Neck supple.  Cardiovascular: Normal rate, regular rhythm and normal heart sounds.  Pulmonary/Chest: Effort normal and breath sounds normal. No respiratory distress.  Abdominal: Soft. There is no tenderness.  Musculoskeletal: He exhibits no edema.       Arms: The patient is exquisitely tender over the right [place] joint.  He is not tender in the insertion sites of the sternocleidomastoid muscle or scalenes. He is minimally tender in  the trapezius and the musculature between the shoulder blade and vertebrae on the right side.  Patient has pain with movement of the shoulder and is guarding.  He does not hurt in the glenohumeral joint the pain occurs in the [facility] joint.  He has full range of motion of his neck, normal and equal bilateral grip strength, no swelling in the upper extremities, no paresthesia.  Equal bilateral radial pulses.  Neurological: He is alert.  Skin: Skin is warm and dry. He is not diaphoretic.  Psychiatric: His behavior is normal.  Nursing note and vitals reviewed.    ED Treatments / Results  Labs (all labs ordered are listed, but only abnormal results are displayed) Labs Reviewed - No data to display  EKG None  Radiology Dg Chest 2 View  Result Date: 05/28/2018 CLINICAL DATA:  Right-sided chest pain EXAM: CHEST - 2 VIEW COMPARISON:  None. FINDINGS: The heart size and mediastinal contours are within normal limits. Both lungs are clear. The visualized skeletal structures are unremarkable. IMPRESSION: No active cardiopulmonary disease. Electronically Signed   By: Jasmine Pang M.D.   On: 05/28/2018 22:05   Ct Cervical Spine Wo Contrast  Result Date: 05/28/2018 CLINICAL DATA:  Right shoulder/scapular pain beginning 2 days ago. No known injury. EXAM: CT CERVICAL SPINE WITHOUT CONTRAST TECHNIQUE: Multidetector CT imaging of the cervical spine was performed without intravenous contrast. Multiplanar CT image reconstructions were also generated. COMPARISON:  None. FINDINGS: Alignment: Normal Skull base and vertebrae: No fracture or primary bone lesion. Soft tissues and spinal canal: No soft tissue lesion. Disc levels: No abnormality at the foramen magnum. C1-2 shows ordinary osteoarthritis but no encroachment upon the neural spaces. C2-3: Mild uncovertebral hypertrophy.  No significant stenosis. C3-4: Mild uncovertebral hypertrophy.  No significant stenosis. C4-5: Mild bulging of the disc.  No compressive  stenosis. C5-6: Mild bulging of the disc.  No compressive stenosis. C6-7: Spondylosis with endplate and uncovertebral osteophytes. Mild bony foraminal narrowing without apparent compressive stenosis. C7-T1: Mild endplate and uncovertebral osteophyte formation. No apparent compressive stenosis. Mild facet osteoarthritis without slippage or encroachment. Upper chest: Negative Other: None IMPRESSION: Degenerative spondylosis throughout the cervical region as outlined above. No evidence of apparently compressive stenosis of the canal or foramina. Electronically Signed   By: Paulina Fusi M.D.   On: 05/28/2018 20:58    Procedures Procedures (including critical care time)  Medications Ordered in ED Medications  ondansetron (ZOFRAN-ODT) disintegrating tablet 4 mg (4 mg Oral Given 05/28/18 2026)  oxyCODONE-acetaminophen (PERCOCET/ROXICET) 5-325 MG per tablet 1 tablet (1 tablet Oral Given 05/28/18 2026)     Initial Impression / Assessment and Plan / ED Course  I have reviewed the triage vital signs and the nursing notes.  Pertinent labs & imaging results that were available during my care of the patient were reviewed  by me and considered in my medical decision making (see chart for details).    Patient with severe joint tenderness localized to the right [place] joint.  It is not hot or red.  I doubt septic joint.  Review of literature does not show this to be an area where gout would be prevalent.  The patient's CT of the neck shows some degenerative changes however I have no suspicion for radiculopathy given the patient's symptoms.  Patient's chest x-ray also reviewed by me shows no significant abnormalities.  Review of literature shows that [place] joint arthritis tends to be the most common cause of this pain and presents as pain that radiates into the shoulder and the shoulder blade which would be congruent with the patient's complaint, although this is not commented upon in the CT scan.  There is no evidence of  fracture, joint subluxation or dislocation on the CT scan.  Patient will be treated conservatively with prednisone taper and I have added a short course of oxycodone after review of the patient's Narc's report.  Patient advised to follow-up with his primary care physician.  Appears otherwise appropriate for discharge at this time   Final Clinical Impressions(s) / ED Diagnoses   Final diagnoses:  Sternoclavicular joint pain, right    ED Discharge Orders         Ordered    oxyCODONE (ROXICODONE) 5 MG immediate release tablet  Every 6 hours PRN     05/28/18 2307    methylPREDNISolone (MEDROL DOSEPAK) 4 MG TBPK tablet     05/28/18 2307           Arthor Captain, PA-C 05/30/18 1610    Cathren Laine, MD 06/03/18 [phone]

## 2018-06-10 ENCOUNTER — Encounter: Payer: Self-pay | Admitting: Family Medicine

## 2018-06-10 ENCOUNTER — Ambulatory Visit (INDEPENDENT_AMBULATORY_CARE_PROVIDER_SITE_OTHER): Payer: PRIVATE HEALTH INSURANCE | Admitting: Family Medicine

## 2018-06-10 VITALS — BP 108/80 | HR 112 | Temp 98.5°F | Wt 224.6 lb

## 2018-06-10 DIAGNOSIS — R0789 Other chest pain: Secondary | ICD-10-CM | POA: Insufficient documentation

## 2018-06-10 MED ORDER — TRAMADOL HCL 50 MG PO TABS: ORAL_TABLET | ORAL | 1 refills | Status: DC

## 2018-06-10 MED ORDER — TIZANIDINE HCL 2 MG PO CAPS: ORAL_CAPSULE | ORAL | 1 refills | Status: DC

## 2018-06-10 NOTE — Progress Notes (Signed)
Sean Romero is a 47 year old male who comes in today for evaluation of right sided subscapular back pain for 2 weeks plus  He states he began experiencing subscapular back pain about 2-1/2 weeks ago.  No history of trauma.  He is been to multiple people including chiropractor orthopedics and the emergency room.  He has had numerous studies done all of which are normal and he still in pain.  He describes the pain is sharp sometimes dull.  It comes and goes.  It is worse at night.  It is a 6 or 7 on a scale of 1-10.  It tends to radiate to his anterior chest wall.  Again no history of trauma  BP 108/80 (BP Location: Right Arm, Patient Position: Sitting, Cuff Size: Large)   Pulse (!) 112   Temp 98.5 F (36.9 C) (Oral)   Wt 224 lb 9.6 oz (101.9 kg)   SpO2 97%   BMI 32.69 kg/m  Well-developed well-nourished male no acute distress vital signs stable he is afebrile examination of the chest shows no palpable tenderness in the spine.  There is tenderness and subscapular muscles.  Also tenderness anterior chest wall.  1.  Chest wall pain............Marland Kitchen Reassured....... treat symptomatically.....Marland Kitchen at physical therapy

## 2018-06-10 NOTE — Patient Instructions (Signed)
Zanaflex..........Marland Kitchen 1 at bedtime for muscle spasm  Tramadol 50 mg.Marland Kitchen... 1/2 to 1 tablet at bedtime as needed for severe pain  Ice packs 15 to 20 minutes 3 or 4 times a day  We will get you set up for physical therapy ASAP  Motrin 400 mg 3 times daily with food

## 2018-06-14 ENCOUNTER — Encounter: Payer: Self-pay | Admitting: Physical Therapy

## 2018-06-14 ENCOUNTER — Ambulatory Visit: Payer: PRIVATE HEALTH INSURANCE | Admitting: Physical Therapy

## 2018-06-14 ENCOUNTER — Other Ambulatory Visit: Payer: Self-pay

## 2018-06-14 ENCOUNTER — Ambulatory Visit: Payer: PRIVATE HEALTH INSURANCE | Attending: Family Medicine | Admitting: Physical Therapy

## 2018-06-14 DIAGNOSIS — M62830 Muscle spasm of back: Secondary | ICD-10-CM | POA: Diagnosis not present

## 2018-06-14 DIAGNOSIS — R293 Abnormal posture: Secondary | ICD-10-CM | POA: Diagnosis present

## 2018-06-14 DIAGNOSIS — M6281 Muscle weakness (generalized): Secondary | ICD-10-CM | POA: Diagnosis present

## 2018-06-14 NOTE — Patient Instructions (Signed)
Access Code: [email or web address]  URL: [email or web address]  Date: 06/14/2018  Prepared by: Donita Brooks   Exercises  Standing Backward Shoulder Rolls - 10 reps - 1 sets - 2x daily - 7x weekly  Sidelying Thoracic and Shoulder Rotation - 10 reps - 1 sets - 2x daily - 7x weekly    [facility] [address] [place], [place] [address] Phone # [phone] Fax [phone]

## 2018-06-14 NOTE — Therapy (Signed)
[facility] [address] [place], [place], [address] Phone: [phone]   Fax:  [phone]  Physical Therapy Evaluation  Patient Details  Name: Sean Romero MRN: [id number] Date of Birth: Mar 08, 1971 Referring Provider (PT): Kelle Darting, MD    Encounter Date: 06/14/2018  PT End of Session - 06/14/18 0930    Visit Number  1    Date for PT Re-Evaluation  07/15/18    Authorization Type  Generic first     Authorization Time Period  06/14/18 to 07/15/18    PT Start Time  0800    PT Stop Time  0842    PT Time Calculation (min)  42 min    Activity Tolerance  Patient tolerated treatment well;No increased pain    Behavior During Therapy  WFL for tasks assessed/performed       Past Medical History:  Diagnosis Date  . Asthma   . Hypertension     History reviewed. No pertinent surgical history.  There were no vitals filed for this visit.   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 He noticed a pinching sensation in his Rt shoulder blade. The next day he went to the ED and got a shot and muscle relaxers. He didn't get much relief for a couple of days. He went to the chiropractor who referred him to an orthopedic MD. The orthopedic MD sent him to the ED where he was given medication and a CT scan which only found some "inflammation". He was encouraged to continue with his medication. Overall his pain is a little bit better, but worse by the end of the day.     Currently in Pain?  Yes    Pain Score  2     Pain Location  Chest    Pain Orientation  Right;Anterior    Pain Descriptors / Indicators  Aching    Pain Type  Acute pain    Pain Radiating Towards  underneath arm    Pain Onset  1 to 4 weeks ago    Pain Frequency  Constant    Aggravating Factors   laying on Lt side, getting up from laying down, picking  up his daughter    Pain Relieving Factors  laying on Rt side, avoiding aggravating movement    Effect of Pain on Daily Activities  pain with daily activity          [facility] PT Assessment - 06/14/18 0001      Assessment   Medical Diagnosis  chest wall pain    Referring Provider (PT)  Kelle Darting, MD     Onset Date/Surgical Date  --   ~2 weeks ago    Hand Dominance  Right    Prior Therapy  none for this       Precautions   Precautions  None      Balance Screen   Has the patient fallen in the past 6 months  No    Has the patient had a decrease in activity level because of a fear of falling?   No    Is the patient reluctant to leave their home because of a fear of falling?   No      Home Public house manager residence      Prior Function   Vocation Requirements  GM at an  office       Sensation   Additional Comments  denies numbness/tingling      Posture/Postural Control   Posture Comments  forward head, rounded shoulders       ROM / Strength   AROM / PROM / Strength  AROM;Strength      AROM   AROM Assessment Site  Shoulder;Cervical    Right/Left Shoulder  Right    Right Shoulder Flexion  160 Degrees    Right Shoulder ABduction  150 Degrees   painful inferior shoulder blade   Cervical Flexion  30   pain Rt inferior scapula   Cervical Extension  40   pain Rt inferior scapula   Cervical - Right Rotation  55    Cervical - Left Rotation  55      Strength   Overall Strength Comments  painful scap retraction and shoulder horizontal abduction on Rt     Strength Assessment Site  Shoulder    Right/Left Shoulder  Right;Left    Right Shoulder Flexion  5/5    Right Shoulder Extension  4/5   pain limited    Right Shoulder ABduction  5/5    Right Shoulder Horizontal ABduction  4/5   pain limited      Palpation   Spinal mobility  hypomobile thoracic spine, hypomobile Rt 1st rib     Palpation comment  tenderness along Rt proximal lat; teres muscle  group, Rt thoracic paraspinals, rhomboids, middle trap                Objective measurements completed on examination: See above findings.      [facility] Adult PT Treatment/Exercise - 06/14/18 0001      Exercises   Exercises  Shoulder      Shoulder Exercises: Seated   Other Seated Exercises  shoulder rolls posterior x5 reps, HEP demo       Shoulder Exercises: Sidelying   Other Sidelying Exercises  thoracic rotation to Rt x5 reps, HEP demo       Manual Therapy   Manual Therapy  Soft tissue mobilization;Joint mobilization    Soft tissue mobilization  trigger point release Rt rhomboids, Rt thoracic paraspinals              PT Education - 06/14/18 0843    Education Details  eval findings/POC; implemented and reviewed HEP; self trigger point release at home    Person(s) Educated  Patient    Methods  Explanation;Verbal cues;Demonstration;Handout    Comprehension  Verbalized understanding;Returned demonstration       PT Short Term Goals - 06/14/18 0759      PT SHORT TERM GOAL #1   Title  Pt will demo consistency and independence with his initial HEP to decrease pain with activity.     Time  2    Period  Weeks    Status  New    Target Date  06/28/18        PT Long Term Goals - 06/14/18 0846      PT LONG TERM GOAL #1   Title  Pt will demo improved scapular strength evident by increase in middle trap activation to 5/5 MMT without increase in pain.     Time  4    Period  Weeks    Status  New    Target Date  07/15/18      PT LONG TERM GOAL #2   Title  Pt will report being able to sleep without increase in pain, atleast  4 days a week, which will improve his quality of sleep.    Time  4    Period  Weeks    Status  New      PT LONG TERM GOAL #3   Title  Pt will report atleast 80% improvement in his pain from the start of PT which will allow him to play with his daughter and return to golf without limitation.     Time  4    Period  Weeks    Status  New       PT LONG TERM GOAL #4   Title  Pt will have pain free cervical ROM to reflect improvements in muscle spasm and pain.     Time  4    Period  Weeks    Status  New             Plan - 06/14/18 0932    Clinical Impression Statement  Pt is a pleasant 47 y.o M referred to OPPT with complaints of Rt medial/inferior scapula pain and intermittent antero-lateral chest pain that wraps underneath his Rt arm. Pt's pain was onset 2 weeks ago insidiously and he has been to multiple physicians since. He has been taking medication and using ice PRN and overall his pain has improved some since the onset. He has shoulder strength WNL, however his pain is reproduced with middle trap/rhomboid testing and shoulder extension primarily. He is hypomobile throughout the thoracic spine, with palpable tenderness and trigger points along the thoracic paraspinals, middle trap and proximal latissimus dorsi. His daily activity is currently limited secondary to pain and he would benefit from skilled PT intervention to decrease muscle spasm, improve spine mobility and posture awareness for full return to pain free activity and home and work.     Clinical Presentation  Stable    Clinical Decision Making  Low    Rehab Potential  Good    PT Frequency  2x / week    PT Duration  4 weeks    PT Treatment/Interventions  ADLs/Self Care Home Management;Moist Heat;Electrical Stimulation;Cryotherapy;Therapeutic exercise;Therapeutic activities;Neuromuscular re-education;Patient/family education;Manual techniques;Passive range of motion;Dry needling;Taping    PT Next Visit Plan  possible d/n to thoracic paraspinals/middle trap/lats if pt interested; thoracic mobility; scap strength and stretches    PT Home Exercise Plan  [id number]     Consulted and Agree with Plan of Care  Patient       Patient will benefit from skilled therapeutic intervention in order to improve the following deficits and impairments:  Decreased activity tolerance,  Decreased strength, Impaired flexibility, Postural dysfunction, Pain, Improper body mechanics, Decreased range of motion, Hypomobility, Increased muscle spasms  Visit Diagnosis: Muscle spasm of back  Muscle weakness (generalized)  Abnormal posture     Problem List Patient Active Problem List   Diagnosis Date Noted  . Chest wall pain 06/10/2018  . Tendinitis of both elbows 08/13/2017  . Allergic rhinitis due to pollen 08/13/2017  . Snoring 08/13/2017  . Lipids abnormal 09/11/2016  . BPH associated with nocturia 09/11/2016  . Family history of prostate cancer 10/06/2014  . Routine general medical examination at a health care facility 05/27/2013   9:47 AM,06/14/18 Donita Brooks PT, DPT [facility]  [phone]  [facility] [address], [address] [place], [place], [address] Phone: [phone]   Fax:  [phone]  Name: ASHTYN FREILICH MRN: [id number] Date of Birth: 02-Sep-1970

## 2018-06-18 ENCOUNTER — Ambulatory Visit: Payer: PRIVATE HEALTH INSURANCE | Admitting: Physical Therapy

## 2018-06-18 DIAGNOSIS — M62830 Muscle spasm of back: Secondary | ICD-10-CM

## 2018-06-18 DIAGNOSIS — R293 Abnormal posture: Secondary | ICD-10-CM

## 2018-06-18 DIAGNOSIS — M6281 Muscle weakness (generalized): Secondary | ICD-10-CM

## 2018-06-18 NOTE — Therapy (Signed)
[facility] [address] [place], [place], [address] Phone: [phone]   Fax:  [phone]  Physical Therapy Treatment  Patient Details  Name: Sean Romero MRN: [id number] Date of Birth: 12/08/1970 Referring Provider (PT): Kelle DartingJeffrey Todd, MD    Encounter Date: 06/18/2018  PT End of Session - 06/18/18 1219    Visit Number  2    Date for PT Re-Evaluation  07/15/18    Authorization Type  Generic first     Authorization Time Period  06/14/18 to 07/15/18    PT Start Time  1147    PT Stop Time  1231    PT Time Calculation (min)  44 min    Activity Tolerance  Patient tolerated treatment well;No increased pain    Behavior During Therapy  WFL for tasks assessed/performed       Past Medical History:  Diagnosis Date  . Asthma   . Hypertension     No past surgical history on file.  There were no vitals filed for this visit.  Subjective Assessment - 06/18/18 1159    Subjective  Pt relays he has been feeling better overall but last night he had a lot of pain and discomfort and does not know why. It has calmed down today.    Currently in Pain?  Yes    Pain Score  3     Pain Location  Shoulder    Pain Orientation  Right    Pain Descriptors / Indicators  Aching;Tightness                       [facility] Adult PT Treatment/Exercise - 06/18/18 0001      Exercises   Exercises  Shoulder      Shoulder Exercises: Standing   Extension  Both;20 reps    Theraband Level (Shoulder Extension)  Level 2 (Red)    Row  Both;20 reps    Theraband Level (Shoulder Row)  Level 2 (Red)    Other Standing Exercises  low trap/bilat ER 5 sec 2X10      Shoulder Exercises: Pulleys   Flexion  2 minutes      Shoulder Exercises: ROM/Strengthening   UBE (Upper Arm Bike)  L2 sitting 2 min fwd/2 min bkwd      Shoulder Exercises: Stretch   Other Shoulder Stretches  doorway 30 sec X 2    Other Shoulder Stretches  standing rhomboid  stretch/ protraction with Thoracic rotation 5 sec X 10 bilat, sitting T ext over towel roll 5 sec X 15      Modalities   Modalities  Moist Heat;Electrical Stimulation      Moist Heat Therapy   Number Minutes Moist Heat  10 Minutes    Moist Heat Location  --   thoracic     Electrical Stimulation   Electrical Stimulation Location  thoracic    Electrical Stimulation Action  IFC    Electrical Stimulation Parameters  tolerance    Electrical Stimulation Goals  Pain               PT Short Term Goals - 06/14/18 0759      PT SHORT TERM GOAL #1   Title  Pt will demo consistency and independence with his initial HEP to decrease pain with activity.     Time  2    Period  Weeks    Status  New    Target Date  06/28/18  PT Long Term Goals - 06/14/18 0846      PT LONG TERM GOAL #1   Title  Pt will demo improved scapular strength evident by increase in middle trap activation to 5/5 MMT without increase in pain.     Time  4    Period  Weeks    Status  New    Target Date  07/15/18      PT LONG TERM GOAL #2   Title  Pt will report being able to sleep without increase in pain, atleast 4 days a week, which will improve his quality of sleep.    Time  4    Period  Weeks    Status  New      PT LONG TERM GOAL #3   Title  Pt will report atleast 80% improvement in his pain from the start of PT which will allow him to play with his daughter and return to golf without limitation.     Time  4    Period  Weeks    Status  New      PT LONG TERM GOAL #4   Title  Pt will have pain free cervical ROM to reflect improvements in muscle spasm and pain.     Time  4    Period  Weeks    Status  New            Plan - 06/18/18 1220    Clinical Impression Statement  Session focused on thoracic stretching and strengthening today with good tolerance and pt reports his shoulder blade feels better than he expected it would performing some of the exercises. He was trialed with MHP and TENS  post tx to reduce pain and muscle spasms.    Rehab Potential  Good    PT Frequency  2x / week    PT Duration  4 weeks    PT Treatment/Interventions  ADLs/Self Care Home Management;Moist Heat;Electrical Stimulation;Cryotherapy;Therapeutic exercise;Therapeutic activities;Neuromuscular re-education;Patient/family education;Manual techniques;Passive range of motion;Dry needling;Taping    PT Next Visit Plan  possible d/n to thoracic paraspinals/middle trap/lats if pt interested; thoracic mobility; scap strength and stretches    PT Home Exercise Plan  [id number]     Consulted and Agree with Plan of Care  Patient       Patient will benefit from skilled therapeutic intervention in order to improve the following deficits and impairments:  Decreased activity tolerance, Decreased strength, Impaired flexibility, Postural dysfunction, Pain, Improper body mechanics, Decreased range of motion, Hypomobility, Increased muscle spasms  Visit Diagnosis: Muscle spasm of back  Muscle weakness (generalized)  Abnormal posture     Problem List Patient Active Problem List   Diagnosis Date Noted  . Chest wall pain 06/10/2018  . Tendinitis of both elbows 08/13/2017  . Allergic rhinitis due to pollen 08/13/2017  . Snoring 08/13/2017  . Lipids abnormal 09/11/2016  . BPH associated with nocturia 09/11/2016  . Family history of prostate cancer 10/06/2014  . Routine general medical examination at a health care facility 05/27/2013    April Manson, PT,DPT 06/18/2018, 12:22 PM  [facility] [address] [place], [place], [address] Phone: [phone]   Fax:  [phone]  Name: Sean Romero MRN: [id number] Date of Birth: 1970/12/29

## 2018-06-21 ENCOUNTER — Ambulatory Visit: Payer: PRIVATE HEALTH INSURANCE | Admitting: Physical Therapy

## 2018-06-21 DIAGNOSIS — R293 Abnormal posture: Secondary | ICD-10-CM

## 2018-06-21 DIAGNOSIS — M62830 Muscle spasm of back: Secondary | ICD-10-CM

## 2018-06-21 DIAGNOSIS — M6281 Muscle weakness (generalized): Secondary | ICD-10-CM

## 2018-06-21 NOTE — Therapy (Signed)
[facility] [address], [address] [place], [place], [address] Phone: [phone]   Fax:  [phone]  Physical Therapy Treatment  Patient Details  Name: Sean Romero MRN: [id number] Date of Birth: Dec 28, 1970 Referring Provider (PT): Kelle Darting, MD    Encounter Date: 06/21/2018  PT End of Session - 06/21/18 0853    Visit Number  3    Date for PT Re-Evaluation  07/15/18    Authorization Type  Generic first     Authorization Time Period  06/14/18 to 07/15/18    PT Start Time  0842    PT Stop Time  0930    PT Time Calculation (min)  48 min    Activity Tolerance  Patient tolerated treatment well    Behavior During Therapy  [facility] for tasks assessed/performed       Past Medical History:  Diagnosis Date  . Asthma   . Hypertension     No past surgical history on file.  There were no vitals filed for this visit.  Subjective Assessment - 06/21/18 0852    Subjective  Pt relays he felt refreshed after last visit but after traveling the rest of the week and he did not fly in until midnight last night he is tired and sore today.    Currently in Pain?  Yes    Pain Score  4     Pain Location  Scapula    Pain Orientation  Right;Posterior    Pain Descriptors / Indicators  Sore                       [facility] Adult PT Treatment/Exercise - 06/21/18 0001      Exercises   Exercises  Shoulder      Shoulder Exercises: Supine   Protraction  Right;20 reps    Protraction Weight (lbs)  3 lbs      Shoulder Exercises: Standing   Extension  Both;20 reps    Theraband Level (Shoulder Extension)  Level 2 (Red)    Row  Both;20 reps    Theraband Level (Shoulder Row)  Level 2 (Red)    Other Standing Exercises  low trap/bilat ER  2X10 yellow Tband    Other Standing Exercises  standing ball rolls on wall shoulder stabilization with unweighted ball X 20 cw/ccw      Shoulder Exercises: Pulleys   Flexion  2 minutes    ABduction   2 minutes      Shoulder Exercises: ROM/Strengthening   UBE (Upper Arm Bike)  L2 sitting 2 min fwd/2 min bkwd      Shoulder Exercises: Stretch   Other Shoulder Stretches  doorway 30 sec X 3    Other Shoulder Stretches  standing rhomboid stretch/ protraction with Thoracic rotation 5 sec X 10 bilat, sitting T ext over towel roll 5 sec X 15      Modalities   Modalities  Moist Heat;Electrical Stimulation      Moist Heat Therapy   Number Minutes Moist Heat  15 Minutes    Moist Heat Location  --   thoracic, Rt scapula     Electrical Stimulation   Electrical Stimulation Location  thoracic, Rt scapula    Electrical Stimulation Action  IFC    Electrical Stimulation Parameters  tolerance    Electrical Stimulation Goals  Pain               PT Short Term Goals - 06/14/18 6962  PT SHORT TERM GOAL #1   Title  Pt will demo consistency and independence with his initial HEP to decrease pain with activity.     Time  2    Period  Weeks    Status  New    Target Date  06/28/18        PT Long Term Goals - 06/14/18 0846      PT LONG TERM GOAL #1   Title  Pt will demo improved scapular strength evident by increase in middle trap activation to 5/5 MMT without increase in pain.     Time  4    Period  Weeks    Status  New    Target Date  07/15/18      PT LONG TERM GOAL #2   Title  Pt will report being able to sleep without increase in pain, atleast 4 days a week, which will improve his quality of sleep.    Time  4    Period  Weeks    Status  New      PT LONG TERM GOAL #3   Title  Pt will report atleast 80% improvement in his pain from the start of PT which will allow him to play with his daughter and return to golf without limitation.     Time  4    Period  Weeks    Status  New      PT LONG TERM GOAL #4   Title  Pt will have pain free cervical ROM to reflect improvements in muscle spasm and pain.     Time  4    Period  Weeks    Status  New            Plan -  06/21/18 0855    Clinical Impression Statement  Session again focused on scapular-thoraic stetching and strengthening to tolerance with addition of protraction and shoulder stabilization ball rolls on wall. He again recieved MHP and TENS for spasm and tightness as he relays this helped some after last session.    Rehab Potential  Good    PT Frequency  2x / week    PT Duration  4 weeks    PT Treatment/Interventions  ADLs/Self Care Home Management;Moist Heat;Electrical Stimulation;Cryotherapy;Therapeutic exercise;Therapeutic activities;Neuromuscular re-education;Patient/family education;Manual techniques;Passive range of motion;Dry needling;Taping    PT Next Visit Plan  possible d/n to thoracic paraspinals/middle trap/lats if pt interested; thoracic mobility; scap strength and stretches    PT Home Exercise Plan  [id number]     Consulted and Agree with Plan of Care  Patient       Patient will benefit from skilled therapeutic intervention in order to improve the following deficits and impairments:  Decreased activity tolerance, Decreased strength, Impaired flexibility, Postural dysfunction, Pain, Improper body mechanics, Decreased range of motion, Hypomobility, Increased muscle spasms  Visit Diagnosis: Muscle spasm of back  Muscle weakness (generalized)  Abnormal posture     Problem List Patient Active Problem List   Diagnosis Date Noted  . Chest wall pain 06/10/2018  . Tendinitis of both elbows 08/13/2017  . Allergic rhinitis due to pollen 08/13/2017  . Snoring 08/13/2017  . Lipids abnormal 09/11/2016  . BPH associated with nocturia 09/11/2016  . Family history of prostate cancer 10/06/2014  . Routine general medical examination at a health care facility 05/27/2013    April Manson, PT,DPT 06/21/2018, 9:29 AM  [facility] [address], [address] [place], [place], [address] Phone: [phone]  Fax:  [phone]  Name:  Sean Romero MRN: [id number] Date of Birth: 09/07/1970

## 2018-06-25 ENCOUNTER — Ambulatory Visit: Payer: PRIVATE HEALTH INSURANCE | Admitting: Physical Therapy

## 2018-06-25 ENCOUNTER — Encounter: Payer: Self-pay | Admitting: Physical Therapy

## 2018-06-25 DIAGNOSIS — M6281 Muscle weakness (generalized): Secondary | ICD-10-CM

## 2018-06-25 DIAGNOSIS — M62830 Muscle spasm of back: Secondary | ICD-10-CM

## 2018-06-25 DIAGNOSIS — R293 Abnormal posture: Secondary | ICD-10-CM

## 2018-06-25 NOTE — Therapy (Signed)
[facility] [address], [address] [place], [place], [address] Phone: [phone]   Fax:  [phone]  Physical Therapy Treatment  Patient Details  Name: Sean Romero MRN: [id number] Date of Birth: [birth year] Referring Provider (PT): Kelle Darting, MD    Encounter Date: 06/25/2018  PT End of Session - 06/25/18 1653    Visit Number  4    Date for PT Re-Evaluation  07/15/18    Authorization Type  Generic first     Authorization Time Period  06/14/18 to 07/15/18    PT Start Time  1615    PT Stop Time  1703    PT Time Calculation (min)  48 min    Activity Tolerance  Patient tolerated treatment well;No increased pain    Behavior During Therapy  WFL for tasks assessed/performed       Past Medical History:  Diagnosis Date  . Asthma   . Hypertension     History reviewed. No pertinent surgical history.  There were no vitals filed for this visit.  Subjective Assessment - 06/25/18 1616    Subjective  Pt reports that he has been able to complete his HEP, and he thinks that his pain is not as intense as it used to be.     Currently in Pain?  No/denies                       [facility] Adult PT Treatment/Exercise - 06/25/18 0001      Exercises   Exercises  Other Exercises    Other Exercises   quadruped threading the needle x10 reps each side       Shoulder Exercises: Sidelying   Other Sidelying Exercises  thoracic rotation to Rt and Lt x10 reps each       Shoulder Exercises: ROM/Strengthening   UBE (Upper Arm Bike)  L2 x2 min forward/backward      Moist Heat Therapy   Number Minutes Moist Heat  10 Minutes    Moist Heat Location  Other (comment)   thoracic     Electrical Stimulation   Electrical Stimulation Location  thoracic, Rt scapular     Electrical Stimulation Action  IFC    Electrical Stimulation Parameters  pt tolerance, x10 min    Electrical Stimulation Goals  Pain      Manual Therapy   Manual  Therapy  Joint mobilization    Joint Mobilization  CPAs Grade III-IV mid to upper thoracic spine x2 bouts     Soft tissue mobilization  STM middle trap, rhomboids              PT Education - 06/25/18 1653    Education Details  encouraged pt to take practice golf swings between now and next session    Person(s) Educated  Patient    Methods  Explanation    Comprehension  Verbalized understanding       PT Short Term Goals - 06/25/18 1656      PT SHORT TERM GOAL #1   Title  Pt will demo consistency and independence with his initial HEP to decrease pain with activity.     Time  2    Period  Weeks    Status  Achieved        PT Long Term Goals - 06/14/18 0846      PT LONG TERM GOAL #1   Title  Pt will demo improved scapular strength evident by increase in middle trap  activation to 5/5 MMT without increase in pain.     Time  4    Period  Weeks    Status  New    Target Date  07/15/18      PT LONG TERM GOAL #2   Title  Pt will report being able to sleep without increase in pain, atleast 4 days a week, which will improve his quality of sleep.    Time  4    Period  Weeks    Status  New      PT LONG TERM GOAL #3   Title  Pt will report atleast 80% improvement in his pain from the start of PT which will allow him to play with his daughter and return to golf without limitation.     Time  4    Period  Weeks    Status  New      PT LONG TERM GOAL #4   Title  Pt will have pain free cervical ROM to reflect improvements in muscle spasm and pain.     Time  4    Period  Weeks    Status  New            Plan - 06/25/18 1654    Clinical Impression Statement  Pt is making progress towards his goals, noting overall decrease in pain intensity from his initial eval. Session focused on therex to promote thoracic mobility and therapist made some adjustments to HEP technique. Ended with TENS and heat per pt request. Pt reported no increase in pain end of session. Will continue with  current POC.     Rehab Potential  Good    PT Frequency  2x / week    PT Duration  4 weeks    PT Treatment/Interventions  ADLs/Self Care Home Management;Moist Heat;Electrical Stimulation;Cryotherapy;Therapeutic exercise;Therapeutic activities;Neuromuscular re-education;Patient/family education;Manual techniques;Passive range of motion;Dry needling;Taping    PT Next Visit Plan  f/u on golf swing; manual treatment as needed for muscle spasm and thoracic hypomobility; scap strength    PT Home Exercise Plan  [id number]     Consulted and Agree with Plan of Care  Patient       Patient will benefit from skilled therapeutic intervention in order to improve the following deficits and impairments:  Decreased activity tolerance, Decreased strength, Impaired flexibility, Postural dysfunction, Pain, Improper body mechanics, Decreased range of motion, Hypomobility, Increased muscle spasms  Visit Diagnosis: Muscle spasm of back  Muscle weakness (generalized)  Abnormal posture     Problem List Patient Active Problem List   Diagnosis Date Noted  . Chest wall pain 06/10/2018  . Tendinitis of both elbows 08/13/2017  . Allergic rhinitis due to pollen 08/13/2017  . Snoring 08/13/2017  . Lipids abnormal 09/11/2016  . BPH associated with nocturia 09/11/2016  . Family history of prostate cancer 10/06/2014  . Routine general medical examination at a health care facility 05/27/2013    4:58 PM,06/25/18 Donita BrooksSara Saulo Anthis PT, DPT [facility]  [phone]  [facility] [address], [address] [place], [place], [address] Phone: [phone]   Fax:  [phone]  Name: Sean Romero MRN: [id number] Date of Birth: 03/16/1971

## 2018-06-27 ENCOUNTER — Encounter: Payer: Self-pay | Admitting: Physical Therapy

## 2018-06-27 ENCOUNTER — Ambulatory Visit: Payer: PRIVATE HEALTH INSURANCE | Admitting: Physical Therapy

## 2018-06-27 DIAGNOSIS — R293 Abnormal posture: Secondary | ICD-10-CM

## 2018-06-27 DIAGNOSIS — M6281 Muscle weakness (generalized): Secondary | ICD-10-CM

## 2018-06-27 DIAGNOSIS — M62830 Muscle spasm of back: Secondary | ICD-10-CM | POA: Diagnosis not present

## 2018-06-27 NOTE — Therapy (Signed)
[facility] [address] [place], [place], [address] Phone: [phone]   Fax:  [phone]  Physical Therapy Treatment  Patient Details  Name: Sean Romero MRN: [id number] Date of Birth: 08/04/1971 Referring Provider (PT): Kelle Darting, MD    Encounter Date: 06/27/2018  PT End of Session - 06/27/18 0814    Visit Number  5    Date for PT Re-Evaluation  07/15/18    Authorization Type  Generic first     Authorization Time Period  06/14/18 to 07/15/18    PT Start Time  0800    PT Stop Time  0850    PT Time Calculation (min)  50 min    Activity Tolerance  Patient tolerated treatment well;No increased pain    Behavior During Therapy  WFL for tasks assessed/performed       Past Medical History:  Diagnosis Date  . Asthma   . Hypertension     History reviewed. No pertinent surgical history.  There were no vitals filed for this visit.  Subjective Assessment - 06/27/18 0802    Subjective  Pt states that he was able to practice his gold swing, he took about 15-20 swings. He did not notice any issues with this afterwards.     Currently in Pain?  No/denies                       [facility] Adult PT Treatment/Exercise - 06/27/18 0001      Exercises   Exercises  Shoulder    Other Exercises   quadruped threading the needle x10 reps each side       Shoulder Exercises: Supine   ABduction  Both;15 reps;Theraband    Theraband Level (Shoulder ABduction)  Level 3 (Green)    ABduction Limitations  horizontal abduction over foam roll     Other Supine Exercises  thoracic extension over foam roll from mid to upper thoracic spine    Other Supine Exercises  UE snow angels over foam roll x15 reps        Shoulder Exercises: Seated   Other Seated Exercises  lat stretch over foam roll with end range thoracic extension x15 reps       Shoulder Exercises: Standing   Row  Strengthening;Both;15 reps    Theraband Level  (Shoulder Row)  Level 4 (Blue)    Row Limitations  tactile cues for proper technique       Shoulder Exercises: ROM/Strengthening   UBE (Upper Arm Bike)  L2 x2 min forward/backward, PT present to discuss progress     Proximal Shoulder Strengthening, Supine  bow and arrow 3x5 reps with green TB       Moist Heat Therapy   Number Minutes Moist Heat  10 Minutes      Electrical Stimulation   Electrical Stimulation Location  thoracic, Rt scapular     Electrical Stimulation Action  IFC    Electrical Stimulation Parameters  pt tolerance x10 min    Electrical Stimulation Goals  Pain             PT Education - 06/27/18 [phone]    Education Details  technique with therex; updated HEP    Person(s) Educated  Patient    Methods  Explanation;Verbal cues    Comprehension  Verbalized understanding;Returned demonstration       PT Short Term Goals - 06/25/18 1656      PT SHORT TERM GOAL #1   Title  Pt will demo consistency and independence with his initial HEP to decrease pain with activity.     Time  2    Period  Weeks    Status  Achieved        PT Long Term Goals - 06/27/18 8295      PT LONG TERM GOAL #1   Title  Pt will demo improved scapular strength evident by increase in middle trap activation to 5/5 MMT without increase in pain.     Time  4    Period  Weeks    Status  New      PT LONG TERM GOAL #2   Title  Pt will report being able to sleep without increase in pain, atleast 4 days a week, which will improve his quality of sleep.    Baseline  depends on the day    Time  4    Period  Weeks    Status  New      PT LONG TERM GOAL #3   Title  Pt will report atleast 80% improvement in his pain from the start of PT which will allow him to play with his daughter and return to golf without limitation.     Time  4    Period  Weeks    Status  New      PT LONG TERM GOAL #4   Title  Pt will have pain free cervical ROM to reflect improvements in muscle spasm and pain.     Time  4     Period  Weeks    Status  New            Plan - 06/27/18 6213    Clinical Impression Statement  Continued this session with therex to promote thoracic ROM and strength. Pt does have full passive thoracic rotation, however muscle weakness limits active rotation to the Rt more than the Lt. His HEP was updated to reflect his improvements. Pt demonstrates slouched posture and required intermittent cuing to correct this during scapula strengthening exercises, and did have some initial difficulty with scapula depression during standing rows. Pt was able to take practice golf swing yesterday without exacerbation of his symptoms and was encouraged to increase the frequency with this moving forward. Will continue with current POC.     Rehab Potential  Good    PT Frequency  2x / week    PT Duration  4 weeks    PT Treatment/Interventions  ADLs/Self Care Home Management;Moist Heat;Electrical Stimulation;Cryotherapy;Therapeutic exercise;Therapeutic activities;Neuromuscular re-education;Patient/family education;Manual techniques;Passive range of motion;Dry needling;Taping    PT Next Visit Plan  scap strength progression; manual treatment as needed for muscle spasm and thoracic hypomobility; scap strength    PT Home Exercise Plan  [id number]     Consulted and Agree with Plan of Care  Patient       Patient will benefit from skilled therapeutic intervention in order to improve the following deficits and impairments:  Decreased activity tolerance, Decreased strength, Impaired flexibility, Postural dysfunction, Pain, Improper body mechanics, Decreased range of motion, Hypomobility, Increased muscle spasms  Visit Diagnosis: Muscle spasm of back  Muscle weakness (generalized)  Abnormal posture     Problem List Patient Active Problem List   Diagnosis Date Noted  . Chest wall pain 06/10/2018  . Tendinitis of both elbows 08/13/2017  . Allergic rhinitis due to pollen 08/13/2017  . Snoring 08/13/2017   . Lipids abnormal 09/11/2016  . BPH associated with nocturia 09/11/2016  . Family history  of prostate cancer 10/06/2014  . Routine general medical examination at a health care facility 05/27/2013    9:20 AM,06/27/18 Donita BrooksSara Kesler Wickham PT, DPT [facility]  [phone]  [facility] [address], [address] [place], [place], [address] Phone: [phone]   Fax:  [phone]  Name: Sean Romero MRN: [id number] Date of Birth: 12/02/1970

## 2018-07-01 ENCOUNTER — Encounter: Payer: Self-pay | Admitting: Physical Therapy

## 2018-07-01 ENCOUNTER — Ambulatory Visit: Payer: PRIVATE HEALTH INSURANCE | Admitting: Physical Therapy

## 2018-07-01 DIAGNOSIS — M62830 Muscle spasm of back: Secondary | ICD-10-CM

## 2018-07-01 DIAGNOSIS — M6281 Muscle weakness (generalized): Secondary | ICD-10-CM

## 2018-07-01 DIAGNOSIS — R293 Abnormal posture: Secondary | ICD-10-CM

## 2018-07-01 NOTE — Therapy (Signed)
[facility] [address], [address] [place], [place], [address] Phone: [phone]   Fax:  [phone]  Physical Therapy Treatment  Patient Details  Name: Sean Romero MRN: [id number] Date of Birth: June 28, 1971 Referring Provider (PT): Kelle Darting, MD    Encounter Date: 07/01/2018  PT End of Session - 07/01/18 0843    Visit Number  6    Date for PT Re-Evaluation  07/15/18    Authorization Type  Generic first     Authorization Time Period  06/14/18 to 07/15/18    PT Start Time  0800    PT Stop Time  0850    PT Time Calculation (min)  50 min    Activity Tolerance  Patient tolerated treatment well;No increased pain    Behavior During Therapy  WFL for tasks assessed/performed       Past Medical History:  Diagnosis Date  . Asthma   . Hypertension     History reviewed. No pertinent surgical history.  There were no vitals filed for this visit.  Subjective Assessment - 07/01/18 0802    Subjective  Pt reports that things are going well. He was sick on Saturday which made it difficult to do his HEP. He has no pain currently, just some tightness.     Currently in Pain?  No/denies                       [facility] Adult PT Treatment/Exercise - 07/01/18 0001      Exercises   Exercises  Shoulder    Other Exercises   quadruped threading the needle x10 reps each side, 2nd set with yellow TB resistance       Shoulder Exercises: Supine   Other Supine Exercises  thoracic extension over foam roll from mid to upper thoracic spine    Other Supine Exercises  D2 flexion with red TB over foam roll x15 reps each      Shoulder Exercises: ROM/Strengthening   Proximal Shoulder Strengthening, Supine  lat pull down #45 2x10 reps     Proximal Shoulder Strengthening, Seated  power tower BUE extension #25 2x15 reps       Shoulder Exercises: Stretch   Other Shoulder Stretches  seated B lat stretch over foam roll with end range  thoracic extension x15 reps     Other Shoulder Stretches  sidelying thoracic rotation      Shoulder Exercises: Power Soil scientist Exercises  standing diagonal lifts with #20 x10 reps each, heavy cues for proper technique       Moist Heat Therapy   Number Minutes Moist Heat  10 Minutes      Electrical Stimulation   Electrical Stimulation Location  thoracic, Rt scapular     Electrical Stimulation Action  IFC    Electrical Stimulation Parameters  Pt tolerance x10 min     Electrical Stimulation Goals  Pain             PT Education - 07/01/18 [phone]    Education Details  technique with therex    Person(s) Educated  Patient    Methods  Explanation;Verbal cues    Comprehension  Verbalized understanding;Returned demonstration       PT Short Term Goals - 06/25/18 1656      PT SHORT TERM GOAL #1   Title  Pt will demo consistency and independence with his initial HEP to decrease pain with activity.     Time  2    Period  Weeks    Status  Achieved        PT Long Term Goals - 06/27/18 1610      PT LONG TERM GOAL #1   Title  Pt will demo improved scapular strength evident by increase in middle trap activation to 5/5 MMT without increase in pain.     Time  4    Period  Weeks    Status  New      PT LONG TERM GOAL #2   Title  Pt will report being able to sleep without increase in pain, atleast 4 days a week, which will improve his quality of sleep.    Baseline  depends on the day    Time  4    Period  Weeks    Status  New      PT LONG TERM GOAL #3   Title  Pt will report atleast 80% improvement in his pain from the start of PT which will allow him to play with his daughter and return to golf without limitation.     Time  4    Period  Weeks    Status  New      PT LONG TERM GOAL #4   Title  Pt will have pain free cervical ROM to reflect improvements in muscle spasm and pain.     Time  4    Period  Weeks    Status  New            Plan - 07/01/18 0843     Clinical Impression Statement  Pt is doing well, reporting minimal issues with pain over the weekend. Session focused on improving thoracic rotation strength in various positions, and pt required cues for proper technique with this during standing diagonal lifts. He does tend to increase hip rotation rather than upper thoracic rotation secondary to thoracic muscle weakness. Pt noted muscle fatigue end of session, but no increase in pain. Will continue with current POC.    Rehab Potential  Good    PT Frequency  2x / week    PT Duration  4 weeks    PT Treatment/Interventions  ADLs/Self Care Home Management;Moist Heat;Electrical Stimulation;Cryotherapy;Therapeutic exercise;Therapeutic activities;Neuromuscular re-education;Patient/family education;Manual techniques;Passive range of motion;Dry needling;Taping    PT Next Visit Plan  scap strength progression; manual treatment as needed for muscle spasm and thoracic hypomobility; scap strength    PT Home Exercise Plan  [id number]     Consulted and Agree with Plan of Care  Patient       Patient will benefit from skilled therapeutic intervention in order to improve the following deficits and impairments:  Decreased activity tolerance, Decreased strength, Impaired flexibility, Postural dysfunction, Pain, Improper body mechanics, Decreased range of motion, Hypomobility, Increased muscle spasms  Visit Diagnosis: Muscle spasm of back  Muscle weakness (generalized)  Abnormal posture     Problem List Patient Active Problem List   Diagnosis Date Noted  . Chest wall pain 06/10/2018  . Tendinitis of both elbows 08/13/2017  . Allergic rhinitis due to pollen 08/13/2017  . Snoring 08/13/2017  . Lipids abnormal 09/11/2016  . BPH associated with nocturia 09/11/2016  . Family history of prostate cancer 10/06/2014  . Routine general medical examination at a health care facility 05/27/2013    8:46 AM,07/01/18 Donita Brooks PT, DPT [facility]  [phone]  [facility] [address], [address] [place], [place],  [address] Phone: [phone]   Fax:  [phone]  Name: Dorina HoyerRaymond B Pena MRN: [id number] Date of Birth: 01/09/1971

## 2018-07-10 ENCOUNTER — Ambulatory Visit: Payer: PRIVATE HEALTH INSURANCE | Attending: Family Medicine | Admitting: Physical Therapy

## 2018-07-10 ENCOUNTER — Encounter: Payer: Self-pay | Admitting: Physical Therapy

## 2018-07-10 DIAGNOSIS — R293 Abnormal posture: Secondary | ICD-10-CM | POA: Insufficient documentation

## 2018-07-10 DIAGNOSIS — M6281 Muscle weakness (generalized): Secondary | ICD-10-CM | POA: Insufficient documentation

## 2018-07-10 DIAGNOSIS — M62830 Muscle spasm of back: Secondary | ICD-10-CM | POA: Diagnosis present

## 2018-07-10 NOTE — Therapy (Signed)
[facility] [address], [address] [place], [place], [address] Phone: [phone]   Fax:  [phone]  Physical Therapy Treatment  Patient Details  Name: Sean HoyerRaymond B Cavagnaro MRN: [id number] Date of Birth: 12/24/1970 Referring Provider (PT): Kelle DartingJeffrey Todd, MD    Encounter Date: 07/10/2018  PT End of Session - 07/10/18 1537    Visit Number  7    Date for PT Re-Evaluation  07/15/18    Authorization Type  Generic first     Authorization Time Period  06/14/18 to 07/15/18    PT Start Time  1535    PT Stop Time  1630    PT Time Calculation (min)  55 min    Activity Tolerance  Patient tolerated treatment well;No increased pain    Behavior During Therapy  WFL for tasks assessed/performed       Past Medical History:  Diagnosis Date  . Asthma   . Hypertension     History reviewed. No pertinent surgical history.  There were no vitals filed for this visit.  Subjective Assessment - 07/10/18 1538    Subjective  I was sore for 2-3 days after last session. No pain today.    Currently in Pain?  No/denies    Multiple Pain Sites  No                       [facility] Adult PT Treatment/Exercise - 07/10/18 0001      Shoulder Exercises: Supine   Other Supine Exercises  thoracic extension over foam roll from mid to upper thoracic spine    Other Supine Exercises  D2 flexion with green TB over foam roll x15 reps each, D2 flexion 10x bil   Shoulder AROM on foam roll pre band work     Shoulder Exercises: Standing   Other Standing Exercises  1 plate D2 flexion/diagonal lift 2x10      Shoulder Exercises: ROM/Strengthening   UBE (Upper Arm Bike)  L2 3x3    Concurrent review of status, VC for posture   Proximal Shoulder Strengthening, Supine  lat pull down #45 15x2  reps    TC to increase awareness of lats   Proximal Shoulder Strengthening, Seated  power tower BUE extension #25 2x15 reps    Rows 25# 2x15     Moist Heat Therapy   Number Minutes Moist Heat  15 Minutes      Electrical Stimulation   Electrical Stimulation Location  thoracic, Rt scapular     Electrical Stimulation Action  IFC    Electrical Stimulation Goals  Pain               PT Short Term Goals - 06/25/18 1656      PT SHORT TERM GOAL #1   Title  Pt will demo consistency and independence with his initial HEP to decrease pain with activity.     Time  2    Period  Weeks    Status  Achieved        PT Long Term Goals - 06/27/18 [id number]      PT LONG TERM GOAL #1   Title  Pt will demo improved scapular strength evident by increase in middle trap activation to 5/5 MMT without increase in pain.     Time  4    Period  Weeks    Status  New      PT LONG TERM GOAL #2   Title  Pt will report being able  to sleep without increase in pain, atleast 4 days a week, which will improve his quality of sleep.    Baseline  depends on the day    Time  4    Period  Weeks    Status  New      PT LONG TERM GOAL #3   Title  Pt will report atleast 80% improvement in his pain from the start of PT which will allow him to play with his daughter and return to golf without limitation.     Time  4    Period  Weeks    Status  New      PT LONG TERM GOAL #4   Title  Pt will have pain free cervical ROM to reflect improvements in muscle spasm and pain.     Time  4    Period  Weeks    Status  New            Plan - 07/10/18 1537    Clinical Impression Statement  Pt reports he is 85% improved since evaluation. He has essentially not had pain for the last few days, and typically hass muscular soreness only after his PT sessions.     Rehab Potential  Good    PT Frequency  2x / week    PT Duration  4 weeks    PT Treatment/Interventions  ADLs/Self Care Home Management;Moist Heat;Electrical Stimulation;Cryotherapy;Therapeutic exercise;Therapeutic activities;Neuromuscular re-education;Patient/family education;Manual techniques;Passive range of motion;Dry  needling;Taping    PT Next Visit Plan  scap strength progression; manual treatment as needed for muscle spasm and thoracic hypomobility; scap strength    PT Home Exercise Plan  [id number]     Consulted and Agree with Plan of Care  Patient       Patient will benefit from skilled therapeutic intervention in order to improve the following deficits and impairments:  Decreased activity tolerance, Decreased strength, Impaired flexibility, Postural dysfunction, Pain, Improper body mechanics, Decreased range of motion, Hypomobility, Increased muscle spasms  Visit Diagnosis: Muscle spasm of back  Muscle weakness (generalized)  Abnormal posture     Problem List Patient Active Problem List   Diagnosis Date Noted  . Chest wall pain 06/10/2018  . Tendinitis of both elbows 08/13/2017  . Allergic rhinitis due to pollen 08/13/2017  . Snoring 08/13/2017  . Lipids abnormal 09/11/2016  . BPH associated with nocturia 09/11/2016  . Family history of prostate cancer 10/06/2014  . Routine general medical examination at a health care facility 05/27/2013    [facility], PTA 07/10/2018, 4:16 PM  [facility] [address], [address] [place], [place], [address] Phone: [phone]   Fax:  [phone]  Name: Sean Romero MRN: [id number] Date of Birth: 1971/01/23

## 2018-07-12 ENCOUNTER — Encounter: Payer: Self-pay | Admitting: Physical Therapy

## 2018-07-12 ENCOUNTER — Ambulatory Visit: Payer: PRIVATE HEALTH INSURANCE | Admitting: Physical Therapy

## 2018-07-12 DIAGNOSIS — M62830 Muscle spasm of back: Secondary | ICD-10-CM | POA: Diagnosis not present

## 2018-07-12 DIAGNOSIS — R293 Abnormal posture: Secondary | ICD-10-CM

## 2018-07-12 DIAGNOSIS — M6281 Muscle weakness (generalized): Secondary | ICD-10-CM

## 2018-07-12 NOTE — Therapy (Signed)
[facility] [address], [place], [place], [address] Phone: [phone]   Fax:  [phone]  Physical Therapy Treatment/Discharge  Patient Details  Name: Sean Romero MRN: [id number] Date of Birth: 08/05/1971 Referring Provider (PT): Stevie Kern, MD    Encounter Date: 07/12/2018  PT End of Session - 07/12/18 0944    Visit Number  8    Date for PT Re-Evaluation  07/15/18    Authorization Type  Generic first     Authorization Time Period  06/14/18 to 07/15/18    PT Start Time  0932    PT Stop Time  1015    PT Time Calculation (min)  43 min    Activity Tolerance  Patient tolerated treatment well;No increased pain    Behavior During Therapy  WFL for tasks assessed/performed       Past Medical History:  Diagnosis Date  . Asthma   . Hypertension     History reviewed. No pertinent surgical history.  There were no vitals filed for this visit.  Subjective Assessment - 07/12/18 0935    Subjective  Pt reports that things have been good. He has not had any pain for over a week or so. He did some raking without any difficulty as well.  He feels atleast 90% improved with just some soreness following new exercises.     Currently in Pain?  No/denies         [facility] PT Assessment - 07/12/18 0001      Assessment   Medical Diagnosis  chest wall pain    Referring Provider (PT)  Stevie Kern, MD     Onset Date/Surgical Date  --   ~2 weeks ago    Hand Dominance  Right    Prior Therapy  none for this       Precautions   Precautions  None      Balance Screen   Has the patient fallen in the past 6 months  No    Has the patient had a decrease in activity level because of a fear of falling?   No    Is the patient reluctant to leave their home because of a fear of falling?   No      Home Film/video editor residence      Prior Function   Vocation Requirements  GM at an office       Sensation    Additional Comments  denies numbness/tingling      Posture/Postural Control   Posture Comments  forward head, rounded shoulders       AROM   Right Shoulder Flexion  170 Degrees    Right Shoulder ABduction  170 Degrees   pain free   Cervical Flexion  40   pain free   Cervical Extension  40   pain free   Cervical - Right Rotation  65    Cervical - Left Rotation  65      Strength   Overall Strength Comments  painful scap retraction and shoulder horizontal abduction on Rt     Right Shoulder Flexion  5/5    Right Shoulder Extension  5/5   pain free   Right Shoulder ABduction  5/5    Right Shoulder Horizontal ABduction  5/5   pain free     Palpation   Spinal mobility  --    Palpation comment  non tender to palpation along thoracic spine and paraspinals/rhomboids  [facility] PT Treatment/Exercise - 07/12/18 0001      Self-Care   Self-Care  Other Self-Care Comments    Other Self-Care Comments   gradual return to golf swing at 100% by completing practice swings at home graudally increasing effort; discussed HEP updates and recommended frequency      Moist Heat Therapy   Number Minutes Moist Heat  12 Minutes      Electrical Stimulation   Electrical Stimulation Location  thoracic, Rt scapular     Electrical Stimulation Action  IFC    Electrical Stimulation Parameters  intensity to pt tolerance     Electrical Stimulation Goals  Other (comment)   pt request            PT Education - 07/12/18 1149    Education Details  see self-care    Person(s) Educated  Patient    Methods  Explanation    Comprehension  Verbalized understanding       PT Short Term Goals - 07/12/18 1153      PT SHORT TERM GOAL #1   Title  Pt will demo consistency and independence with his initial HEP to decrease pain with activity.     Time  2    Period  Weeks    Status  Achieved        PT Long Term Goals - 07/12/18 1153      PT LONG TERM GOAL #1   Title   Pt will demo improved scapular strength evident by increase in middle trap activation to 5/5 MMT without increase in pain.     Time  4    Period  Weeks    Status  Achieved      PT LONG TERM GOAL #2   Title  Pt will report being able to sleep without increase in pain, atleast 4 days a week, which will improve his quality of sleep.    Baseline  pain free atleast 2 weeks     Time  4    Period  Weeks    Status  Achieved      PT LONG TERM GOAL #3   Title  Pt will report atleast 80% improvement in his pain from the start of PT which will allow him to play with his daughter and return to golf without limitation.     Baseline  90%    Time  4    Period  Weeks    Status  Achieved      PT LONG TERM GOAL #4   Title  Pt will have pain free cervical ROM to reflect improvements in muscle spasm and pain.     Time  4    Period  Weeks    Status  Achieved            Plan - 07/12/18 0946    Clinical Impression Statement  Pt was discharged this visit having met all of his short and long term goals. He has had no pain over the past couple of weeks and he has been able to complete therex/HEP recently without exacerbation of his original complaint. His strength and ROM of the cervical spine and UEs is pain free and within normal limits. Although his posture awareness is minimally improved, he does have understanding of the importance and is able to self-correct when cued. He feels atleast 90% improved and is agreeable with d/c as he has returned to all of his prior activities without limitations or increase in pain. HEP  was updated and therapist discussed progressions of this and return to golf moving forward.     Rehab Potential  Good    PT Frequency  2x / week    PT Duration  4 weeks    PT Treatment/Interventions  ADLs/Self Care Home Management;Moist Heat;Electrical Stimulation;Cryotherapy;Therapeutic exercise;Therapeutic activities;Neuromuscular re-education;Patient/family education;Manual  techniques;Passive range of motion;Dry needling;Taping    PT Next Visit Plan  scap strength progression; manual treatment as needed for muscle spasm and thoracic hypomobility; scap strength    PT Home Exercise Plan  [id number]     Consulted and Agree with Plan of Care  Patient       Patient will benefit from skilled therapeutic intervention in order to improve the following deficits and impairments:  Decreased activity tolerance, Decreased strength, Impaired flexibility, Postural dysfunction, Pain, Improper body mechanics, Decreased range of motion, Hypomobility, Increased muscle spasms  Visit Diagnosis: Muscle spasm of back  Muscle weakness (generalized)  Abnormal posture     Problem List Patient Active Problem List   Diagnosis Date Noted  . Chest wall pain 06/10/2018  . Tendinitis of both elbows 08/13/2017  . Allergic rhinitis due to pollen 08/13/2017  . Snoring 08/13/2017  . Lipids abnormal 09/11/2016  . BPH associated with nocturia 09/11/2016  . Family history of prostate cancer 10/06/2014  . Routine general medical examination at a health care facility 05/27/2013   PHYSICAL THERAPY DISCHARGE SUMMARY  Visits from Start of Care: 8  Current functional level related to goals / functional outcomes: See above for more details    Remaining deficits: See above for more details    Education / Equipment: See above for more details   Plan: Patient agrees to discharge.  Patient goals were met. Patient is being discharged due to meeting the stated rehab goals.  ?????      11:55 AM,07/12/18 [place] [place] [facility] [place]  [facility] [address], [place], [place], [address] Phone: [phone]   Fax:  [phone]  Name: DAVIER TRAMELL MRN: [id number] Date of Birth: 03/07/1971

## 2018-07-16 ENCOUNTER — Encounter: Payer: Self-pay | Admitting: Physical Therapy

## 2018-08-20 ENCOUNTER — Ambulatory Visit (INDEPENDENT_AMBULATORY_CARE_PROVIDER_SITE_OTHER): Payer: PRIVATE HEALTH INSURANCE | Admitting: Family Medicine

## 2018-08-20 ENCOUNTER — Encounter: Payer: Self-pay | Admitting: Family Medicine

## 2018-08-20 VITALS — BP 130/82 | HR 85 | Temp 98.3°F | Ht 69.5 in | Wt 228.5 lb

## 2018-08-20 DIAGNOSIS — N401 Enlarged prostate with lower urinary tract symptoms: Secondary | ICD-10-CM | POA: Diagnosis not present

## 2018-08-20 DIAGNOSIS — R7303 Prediabetes: Secondary | ICD-10-CM

## 2018-08-20 DIAGNOSIS — E785 Hyperlipidemia, unspecified: Secondary | ICD-10-CM

## 2018-08-20 DIAGNOSIS — Z23 Encounter for immunization: Secondary | ICD-10-CM

## 2018-08-20 DIAGNOSIS — R200 Anesthesia of skin: Secondary | ICD-10-CM | POA: Diagnosis not present

## 2018-08-20 DIAGNOSIS — E6609 Other obesity due to excess calories: Secondary | ICD-10-CM

## 2018-08-20 DIAGNOSIS — E669 Obesity, unspecified: Secondary | ICD-10-CM | POA: Insufficient documentation

## 2018-08-20 DIAGNOSIS — Z6833 Body mass index (BMI) 33.0-33.9, adult: Secondary | ICD-10-CM

## 2018-08-20 DIAGNOSIS — R351 Nocturia: Secondary | ICD-10-CM | POA: Diagnosis not present

## 2018-08-20 DIAGNOSIS — E66811 Obesity, class 1: Secondary | ICD-10-CM | POA: Insufficient documentation

## 2018-08-20 LAB — LIPID PANEL
CHOLESTEROL: 204 mg/dL — AB (ref 0–200)
HDL: 50.4 mg/dL (ref >39.00)
LDL CALC: 139 mg/dL — AB (ref 0–99)
NonHDL: 154.09
TRIGLYCERIDES: 73 mg/dL (ref 0.0–149.0)
Total CHOL/HDL Ratio: 4
VLDL: 14.6 mg/dL (ref 0.0–40.0)

## 2018-08-20 LAB — COMPREHENSIVE METABOLIC PANEL
ALK PHOS: 62 U/L (ref 39–117)
ALT: 15 U/L (ref 0–53)
AST: 13 U/L (ref 0–37)
Albumin: 4.1 g/dL (ref 3.5–5.2)
BILIRUBIN TOTAL: 0.6 mg/dL (ref 0.2–1.2)
BUN: 17 mg/dL (ref 6–23)
CALCIUM: 9.4 mg/dL (ref 8.4–10.5)
CO2: 27 meq/L (ref 19–32)
Chloride: 104 mEq/L (ref 96–112)
Creatinine, Ser: 0.9 mg/dL (ref 0.40–1.50)
GFR: 117.21 mL/min (ref >60.00)
Glucose, Bld: 90 mg/dL (ref 70–99)
Potassium: 4 mEq/L (ref 3.5–5.1)
Sodium: 139 mEq/L (ref 135–145)
Total Protein: 7 g/dL (ref 6.0–8.3)

## 2018-08-20 LAB — VITAMIN B12: VITAMIN B 12: 392 pg/mL (ref 211–911)

## 2018-08-20 LAB — PSA: PSA: 1.21 ng/mL (ref 0.10–4.00)

## 2018-08-20 LAB — HEMOGLOBIN A1C: Hgb A1c MFr Bld: 5.9 % (ref 4.6–6.5)

## 2018-08-20 MED ORDER — TAMSULOSIN HCL 0.4 MG PO CAPS: 0.4000 mg | ORAL_CAPSULE | Freq: Every day | ORAL | 3 refills | Status: DC

## 2018-08-20 NOTE — Patient Instructions (Addendum)
A few things to remember from today's visit:   Numbness of right hand - Plan: Comprehensive metabolic panel, Vitamin B12  BPH associated with nocturia - Plan: PSA  Hyperlipidemia, unspecified hyperlipidemia type - Plan: Comprehensive metabolic panel, Lipid panel  Prediabetes - Plan: Comprehensive metabolic panel, Hemoglobin A1c  You can try wearing right splint at night for a few weeks, if numbness on right hand improved you can continue using it for a few weeks at a time and as needed.  Please be sure medication list is accurate. If a new problem present, please set up appointment sooner than planned today.

## 2018-08-20 NOTE — Progress Notes (Signed)
HPI:   Sean Romero is a 48 y.o. male, who is here today to establish care.  Former PCP: Dr. Tawanna Cooler Last preventive routine visit: 09/2016.  Chronic medical problems: Hyperlipidemia, BPH with nocturia, allergy rhinitis, and back pain among some.  Hyperlipidemia: Currently he is on nonpharmacologic treatment. He was on Atorvastatin 10 mg but discontinued  A few months ago. Denies side effects. He has not been consistent with a low fat diet.   Lab Results  Component Value Date   CHOL 220 (H) 12/14/2016   HDL 58.00 12/14/2016   LDLCALC 148 (H) 12/14/2016   LDLDIRECT 138.9 05/27/2013   TRIG 67.0 12/14/2016   CHOLHDL 4 12/14/2016   BPH with nocturia: He is currently on Flomax 0.4 mg daily, which he feels like it has helped tremendously. He has nocturia x1, before starting medication he had nocturia x3. He denies dysuria, urine urgency, incontinence, or gross hematuria.  He has not been consistent with a healthful diet nor with regular exercise. Last A1c mildly elevated. Denies abdominal pain, nausea,vomiting, polydipsia,polyuria, or polyphagia.   Lab Results  Component Value Date   HGBA1C 6.0 12/14/2016    Today he is concerned about 2 weeks Hx of right hand pain and numbness sometimes radiated to forearm. "Pretty" painful. He denies neck pain or weakness. Problem is usually at night, alleviated by shaking hand. Hx of carpal tunnel synd. He has not tried treatments.  Review of Systems  Constitutional: Negative for activity change, appetite change, fatigue and fever.  HENT: Negative for mouth sores, nosebleeds, sore throat and trouble swallowing.   Eyes: Negative for redness and visual disturbance.  Respiratory: Negative for cough, shortness of breath and wheezing.   Cardiovascular: Negative for chest pain, palpitations and leg swelling.  Gastrointestinal: Negative for abdominal pain, nausea and vomiting.  Genitourinary: Negative for decreased urine volume,  dysuria and hematuria.  Musculoskeletal: Positive for arthralgias. Negative for neck pain.  Skin: Negative for pallor and rash.  Neurological: Positive for numbness. Negative for syncope, weakness and headaches.      No current outpatient medications on file prior to visit.   No current facility-administered medications on file prior to visit.      Past Medical History:  Diagnosis Date  . Asthma   . Hypertension    Allergies  Allergen Reactions  . Penicillins Rash    Family History  Problem Relation Age of Onset  . Cancer Father     Social History   Socioeconomic History  . Marital status: Single    Spouse name: Not on file  . Number of children: Not on file  . Years of education: Not on file  . Highest education level: Not on file  Occupational History  . Not on file  Social Needs  . Financial resource strain: Not on file  . Food insecurity:    Worry: Not on file    Inability: Not on file  . Transportation needs:    Medical: Not on file    Non-medical: Not on file  Tobacco Use  . Smoking status: Never Smoker  . Smokeless tobacco: Never Used  Substance and Sexual Activity  . Alcohol use: Yes    Comment: ocassionally  . Drug use: No  . Sexual activity: Not on file  Lifestyle  . Physical activity:    Days per week: Not on file    Minutes per session: Not on file  . Stress: Not on file  Relationships  . Social  connections:    Talks on phone: Not on file    Gets together: Not on file    Attends religious service: Not on file    Active member of club or organization: Not on file    Attends meetings of clubs or organizations: Not on file    Relationship status: Not on file  Other Topics Concern  . Not on file  Social History Narrative  . Not on file    Vitals:   08/20/18 0801  BP: 130/82  Pulse: 85  Temp: 98.3 F (36.8 C)  SpO2: 98%    Body mass index is 33.26 kg/m.      Physical Exam  Nursing note reviewed. Constitutional: He is  oriented to person, place, and time. He appears well-developed. No distress.  HENT:  Head: Normocephalic and atraumatic.  Mouth/Throat: Oropharynx is clear and moist and mucous membranes are normal.  Eyes: Pupils are equal, round, and reactive to light. Conjunctivae are normal.  Cardiovascular: Normal rate and regular rhythm.  No murmur heard. Pulses:      Dorsalis pedis pulses are 2+ on the right side and 2+ on the left side.  Respiratory: Effort normal and breath sounds normal. No respiratory distress.  GI: Soft. He exhibits no mass. There is no hepatomegaly. There is no abdominal tenderness.  Musculoskeletal:        General: No edema.     Comments: Right tinel and phalen negative.   Lymphadenopathy:    He has no cervical adenopathy.  Neurological: He is alert and oriented to person, place, and time. He has normal strength. No cranial nerve deficit. Gait normal.  Skin: Skin is warm. No rash noted. No erythema.  Psychiatric: He has a normal mood and affect. Cognition and memory are normal.  Well groomed, good eye contact.     ASSESSMENT AND PLAN:  Sean Romero was seen today for transfer of care.  Orders Placed This Encounter  Procedures  . Flu Vaccine QUAD 36+ mos IM  . Comprehensive metabolic panel  . Hemoglobin A1c  . Lipid panel  . PSA  . Vitamin B12   Lab Results  Component Value Date   PSA 1.21 08/20/2018   Lab Results  Component Value Date   HGBA1C 5.9 08/20/2018   Lab Results  Component Value Date   CREATININE 0.90 08/20/2018   BUN 17 08/20/2018   NA 139 08/20/2018   K 4.0 08/20/2018   CL 104 08/20/2018   CO2 27 08/20/2018   Lab Results  Component Value Date   ALT 15 08/20/2018   AST 13 08/20/2018   ALKPHOS 62 08/20/2018   BILITOT 0.6 08/20/2018   Lab Results  Component Value Date   VITAMINB12 392 08/20/2018   The 10-year ASCVD risk score Denman George(Goff DC Jr., et al., 2013) is: 4.3%   Values used to calculate the score:     Age: [age] years     Sex:  Male     Is Non-Hispanic African American: Yes     Diabetic: No     Tobacco smoker: No     Systolic Blood Pressure: 130 mmHg     Is BP treated: No     HDL Cholesterol: 50.4 mg/dL     Total Cholesterol: 204 mg/dL  Numbness of right hand We discussed possible etiologies. Carpal tunnel synd most likely. Recommend night time splint. He prefers to hold on work-up (EMG) for now. Instructed about warning signs. F/U as needed.  -  Comprehensive metabolic panel -     Vitamin B12  Class 1 obesity due to excess calories with serious comorbidity and body mass index (BMI) of 33.0 to 33.9 in adult We discussed benefits of wt loss as well as adverse effects of obesity. Consistency with healthy diet and physical activity recommended. .  Hyperlipidemia Recommend low-fat diet for now. Further recommendation will be given according to lipid panel results as well as 10-year risk CVD.  Prediabetes A healthy lifestyle strongly recommended for primary prevention.  BPH associated with nocturia Symptoms are well controlled with Flomax 0.4 mg, no changes in current management.  Class 1 obesity with body mass index (BMI) of 33.0 to 33.9 in adult We discussed benefits of wt loss as well as adverse effects of obesity. Consistency with healthy diet and physical activity recommended.   Need for immunization against influenza -     Flu Vaccine QUAD 36+ mos IM    Return in about 1 year (around 08/21/2019) for cpe.      Betty G. SwazilandJordan, MD  [facility]. [facility].

## 2018-08-20 NOTE — Assessment & Plan Note (Signed)
Symptoms are well controlled with Flomax 0.4 mg, no changes in current management.

## 2018-08-20 NOTE — Assessment & Plan Note (Signed)
We discussed benefits of wt loss as well as adverse effects of obesity. Consistency with healthy diet and physical activity recommended.  

## 2018-08-20 NOTE — Assessment & Plan Note (Signed)
A healthy lifestyle strongly recommended for primary prevention.

## 2018-08-20 NOTE — Assessment & Plan Note (Signed)
Recommend low-fat diet for now. Further recommendation will be given according to lipid panel results as well as 10-year risk CVD.

## 2018-11-18 ENCOUNTER — Encounter: Payer: Self-pay | Admitting: Family Medicine

## 2018-11-18 ENCOUNTER — Other Ambulatory Visit: Payer: Self-pay

## 2018-11-18 ENCOUNTER — Telehealth: Payer: Self-pay | Admitting: *Deleted

## 2018-11-18 ENCOUNTER — Ambulatory Visit (INDEPENDENT_AMBULATORY_CARE_PROVIDER_SITE_OTHER): Payer: PRIVATE HEALTH INSURANCE | Admitting: Family Medicine

## 2018-11-18 VITALS — Resp 16

## 2018-11-18 DIAGNOSIS — N401 Enlarged prostate with lower urinary tract symptoms: Secondary | ICD-10-CM | POA: Diagnosis not present

## 2018-11-18 DIAGNOSIS — R351 Nocturia: Secondary | ICD-10-CM | POA: Diagnosis not present

## 2018-11-18 DIAGNOSIS — H9201 Otalgia, right ear: Secondary | ICD-10-CM | POA: Diagnosis not present

## 2018-11-18 DIAGNOSIS — J301 Allergic rhinitis due to pollen: Secondary | ICD-10-CM | POA: Diagnosis not present

## 2018-11-18 MED ORDER — TAMSULOSIN HCL 0.4 MG PO CAPS: 0.4000 mg | ORAL_CAPSULE | Freq: Every day | ORAL | 2 refills | Status: DC

## 2018-11-18 MED ORDER — FLUTICASONE PROPIONATE 50 MCG/ACT NA SUSP: 1.0000 | Freq: Two times a day (BID) | NASAL | 3 refills | Status: DC

## 2018-11-18 NOTE — Progress Notes (Signed)
Virtual Visit via Video Note   I connected with Mr Harmsen on 11/18/18 at  4:00 PM EDT by a video enabled telemedicine application and verified that I am speaking with the correct person using two identifiers.  Location patient: home Location provider:home office Persons participating in the virtual visit: patient, provider  I discussed the limitations of evaluation and management by telemedicine and the availability of in person appointments. The patient expressed understanding and agreed to proceed.   HPI: Mr Salasar is a 48 YO male with Hx of HLD and prediabetes c/6 days of right earache and sore throat, both resolved 2 days ago. He denies associated fever, chills, body aches, oral lesions, hearing loss,ear drainage,dysphagia, cough, dyspnea, abdominal pain, or skin rash.  He took OTC Robitussin and healthy.  He is having nasal congestion,rhinorrhea, and postnasal drainage. History of allergy rhinitis, he is not taking OTC medications. Symptoms usually aggravated by pollen.  No history of sick contacts or recent travel.  He is also inquiring about prostate check. History of BPH with nocturia. He is currently on Flomax 0.4 mg daily, states that he ahs no refills at his pharmacy. Nocturia x 1.  He denies dysuria, decreased urine output, gross hematuria, or increasing urine frequency. He is tolerating medication well.  ROS: See pertinent positives and negatives per HPI.  Past Medical History:  Diagnosis Date  . Asthma   . Hypertension     No past surgical history on file.  Family History  Problem Relation Age of Onset  . Cancer Father     Social History   Socioeconomic History  . Marital status: Single    Spouse name: Not on file  . Number of children: Not on file  . Years of education: Not on file  . Highest education level: Not on file  Occupational History  . Not on file  Social Needs  . Financial resource strain: Not on file  . Food insecurity:    Worry: Not  on file    Inability: Not on file  . Transportation needs:    Medical: Not on file    Non-medical: Not on file  Tobacco Use  . Smoking status: Never Smoker  . Smokeless tobacco: Never Used  Substance and Sexual Activity  . Alcohol use: Yes    Comment: ocassionally  . Drug use: No  . Sexual activity: Not on file  Lifestyle  . Physical activity:    Days per week: Not on file    Minutes per session: Not on file  . Stress: Not on file  Relationships  . Social connections:    Talks on phone: Not on file    Gets together: Not on file    Attends religious service: Not on file    Active member of club or organization: Not on file    Attends meetings of clubs or organizations: Not on file    Relationship status: Not on file  . Intimate partner violence:    Fear of current or ex partner: Not on file    Emotionally abused: Not on file    Physically abused: Not on file    Forced sexual activity: Not on file  Other Topics Concern  . Not on file  Social History Narrative  . Not on file     Current Outpatient Medications:  .  fluticasone (FLONASE) 50 MCG/ACT nasal spray, Place 1 spray into both nostrils 2 (two) times daily., Disp: 16 g, Rfl: 3 .  tamsulosin (FLOMAX) 0.4 MG  CAPS capsule, Take 1 capsule (0.4 mg total) by mouth daily., Disp: 90 capsule, Rfl: 2  EXAM:  VITALS per patient if applicable:Resp 16   GENERAL: alert, oriented, appears well and in no acute distress  HEENT: atraumatic, conjunttiva clear, no obvious abnormalities on inspection of face.  NECK: normal movements of the head and neck  LUNGS: on inspection no signs of respiratory distress, breathing rate appears normal, no obvious gross SOB, gasping or wheezing  CV: no obvious cyanosis  MS: moves all visible extremities without noticeable abnormality  PSYCH/NEURO: pleasant and cooperative, no obvious depression or anxiety, speech and thought processing grossly intact  ASSESSMENT AND PLAN: Discussed the  following assessment and plan:  Earache on right Resolved. We discussed possible etiologies of earache. ?  Eustachian tube dysfunction vs pain radiated from throat. Instructed about warning signs. Follow-up as needed.   Allergic rhinitis due to pollen Symptomatic. Flonase nasal spray daily as needed. OTC Zyrtec 10 mg daily in the morning. Nasal irrigation with saline as needed. We discussed some side effects of medication.  BPH associated with nocturia Problem is well controlled. We reviewed lab results, 08/20/2018. No changes in Flomax, Rx sent. Follow-up in 08/2019.    I discussed the assessment and treatment plan with the patient. He was provided an opportunity to ask questions and all were answered. The patient agreed with the plan and demonstrated an understanding of the instructions.     Return if symptoms worsen or fail to improve.    Daneille Desilva Swaz[place]Jordan, MD

## 2018-11-18 NOTE — Assessment & Plan Note (Signed)
Symptomatic. Flonase nasal spray daily as needed. OTC Zyrtec 10 mg daily in the morning. Nasal irrigation with saline as needed. We discussed some side effects of medication.

## 2018-11-18 NOTE — Telephone Encounter (Signed)
Copied from CRM [id number]. Topic: Appointment Scheduling - Scheduling Inquiry for Clinic >> Nov 15, 2018  9:03 AM Sean Romero L wrote: Reason for CRM:   Pt would like to schedule OV for pain in right ear. Pt can be reached at [phone].

## 2018-11-18 NOTE — Assessment & Plan Note (Addendum)
Problem is well controlled. We reviewed lab results, 08/20/2018. No changes in Flomax, Rx sent. Follow-up in 08/2019.

## 2019-04-02 ENCOUNTER — Other Ambulatory Visit: Payer: Self-pay | Admitting: Family Medicine

## 2019-04-02 DIAGNOSIS — J301 Allergic rhinitis due to pollen: Secondary | ICD-10-CM

## 2019-05-02 ENCOUNTER — Ambulatory Visit (INDEPENDENT_AMBULATORY_CARE_PROVIDER_SITE_OTHER): Payer: PRIVATE HEALTH INSURANCE | Admitting: Family Medicine

## 2019-05-02 ENCOUNTER — Other Ambulatory Visit: Payer: Self-pay

## 2019-05-02 ENCOUNTER — Encounter: Payer: Self-pay | Admitting: Family Medicine

## 2019-05-02 VITALS — BP 110/72 | HR 83 | Temp 97.4°F | Ht 69.5 in | Wt 222.8 lb

## 2019-05-02 DIAGNOSIS — L509 Urticaria, unspecified: Secondary | ICD-10-CM

## 2019-05-02 MED ORDER — PREDNISONE 10 MG PO TABS: ORAL_TABLET | ORAL | 0 refills | Status: DC

## 2019-05-02 NOTE — Progress Notes (Signed)
   Subjective:    Patient ID: DODGE ATOR, male    DOB: 03-Sep-1970, 48 y.o.   MRN: [id number]  HPI Here for 4 days of an itchy rash on the arms, back, and chest. No recent medication changes, no new soaps or detergents. He feels fine in general. He is taking Benadryl orally and this helps, but it makes him sleepy. He is also using topical Benadryl cream and hydrocortisone.    Review of Systems  Constitutional: Negative.   Respiratory: Negative.   Cardiovascular: Negative.   Skin: Positive for rash.       Objective:   Physical Exam Constitutional:      Appearance: Normal appearance. He is not ill-appearing.  Cardiovascular:     Rate and Rhythm: Normal rate and regular rhythm.     Pulses: Normal pulses.     Heart sounds: Normal heart sounds.  Pulmonary:     Effort: Pulmonary effort is normal.     Breath sounds: Normal breath sounds.  Skin:    Comments: Widespread papular rash on arms and trunk  Neurological:     Mental Status: He is alert.           Assessment & Plan:  Hives, treat with a 15 day taper of Prednisone starting with 50 mg a day. Add Zyrtec BID. Alysia Penna, MD

## 2019-05-13 ENCOUNTER — Ambulatory Visit (INDEPENDENT_AMBULATORY_CARE_PROVIDER_SITE_OTHER): Payer: PRIVATE HEALTH INSURANCE | Admitting: Family Medicine

## 2019-05-13 ENCOUNTER — Other Ambulatory Visit: Payer: Self-pay

## 2019-05-13 ENCOUNTER — Encounter: Payer: Self-pay | Admitting: Family Medicine

## 2019-05-13 VITALS — BP 122/84 | HR 79 | Temp 97.6°F | Resp 12 | Ht 69.5 in | Wt 222.4 lb

## 2019-05-13 DIAGNOSIS — R479 Unspecified speech disturbances: Secondary | ICD-10-CM | POA: Diagnosis not present

## 2019-05-13 DIAGNOSIS — M62838 Other muscle spasm: Secondary | ICD-10-CM | POA: Diagnosis not present

## 2019-05-13 DIAGNOSIS — Z23 Encounter for immunization: Secondary | ICD-10-CM | POA: Diagnosis not present

## 2019-05-13 DIAGNOSIS — R682 Dry mouth, unspecified: Secondary | ICD-10-CM | POA: Diagnosis not present

## 2019-05-13 LAB — CBC
HCT: 42.9 % (ref 39.0–52.0)
Hemoglobin: 14.5 g/dL (ref 13.0–17.0)
MCHC: 33.7 g/dL (ref 30.0–36.0)
MCV: 93.1 fl (ref 78.0–100.0)
Platelets: 255 10*3/uL (ref 150.0–400.0)
RBC: 4.61 Mil/uL (ref 4.22–5.81)
RDW: 14.5 % (ref 11.5–15.5)
WBC: 8.6 10*3/uL (ref 4.0–10.5)

## 2019-05-13 LAB — BASIC METABOLIC PANEL
BUN: 16 mg/dL (ref 6–23)
CO2: 29 mEq/L (ref 19–32)
Calcium: 9.5 mg/dL (ref 8.4–10.5)
Chloride: 101 mEq/L (ref 96–112)
Creatinine, Ser: 0.87 mg/dL (ref 0.40–1.50)
GFR: 114.31 mL/min (ref >60.00)
Glucose, Bld: 118 mg/dL — ABNORMAL HIGH (ref 70–99)
Potassium: 4.4 mEq/L (ref 3.5–5.1)
Sodium: 138 mEq/L (ref 135–145)

## 2019-05-13 LAB — CK: Total CK: 111 U/L (ref 7–232)

## 2019-05-13 NOTE — Progress Notes (Signed)
HPI:  Chief Complaint  Patient presents with  . Dry mouth  . Spasms    slight spasms of the mouth and tighness in jaw at times, started over a month ago    [name] is a 48 y.o. male, who is here today with above complaint. Intermittent episodes of perioral muscle spasms, "twitching" does affect speech. It lasts for a few seconds, he has to stop talking, cannot pronounce words.  It is not associated with MS changes or urine/bowel incontinence.  She has not identified exacerbating or alleviating factors. Problem seems to be stable. Negative for headache, numbness, tingling, or focal neurologic deficit.  Family members have also noted problem and suggested to seek medical attention.  It is associated with dry mouth that makes chewing food difficult, frequently he bites his tongue and cheek. He has not noted oral lesions, sore throat, dysphasia. He has not noted other mucosa surface dryness or ulcers.  Denies fever,chills,fatigue,body ache,or abnormal wt loss.  Review of Systems  Constitutional: Negative for activity change and appetite change.  HENT: Negative for nosebleeds and trouble swallowing.   Eyes: Negative for redness and visual disturbance.  Respiratory: Negative for cough, shortness of breath and wheezing.   Cardiovascular: Negative for chest pain and palpitations.  Gastrointestinal: Negative for abdominal pain, nausea and vomiting.  Musculoskeletal: Negative for arthralgias and joint swelling.  Skin: Negative for rash.  Neurological: Negative for seizures, syncope and facial asymmetry.  Hematological: Negative for adenopathy. Does not bruise/bleed easily.  Rest see pertinent positives and negatives per HPI.   Current Outpatient Medications on File Prior to Visit  Medication Sig Dispense Refill  . fluticasone (FLONASE) 50 MCG/ACT nasal spray SHAKE LIQUID AND USE 1 SPRAY IN EACH NOSTRIL TWICE DAILY 16 g 3  . tamsulosin (FLOMAX) 0.4 MG CAPS  capsule Take 1 capsule (0.4 mg total) by mouth daily. 90 capsule 2   No current facility-administered medications on file prior to visit.      Past Medical History:  Diagnosis Date  . Asthma   . Hypertension    Allergies  Allergen Reactions  . Penicillins Rash    Social History   Socioeconomic History  . Marital status: Single    Spouse name: Not on file  . Number of children: Not on file  . Years of education: Not on file  . Highest education level: Not on file  Occupational History  . Not on file  Social Needs  . Financial resource strain: Not on file  . Food insecurity    Worry: Not on file    Inability: Not on file  . Transportation needs    Medical: Not on file    Non-medical: Not on file  Tobacco Use  . Smoking status: Never Smoker  . Smokeless tobacco: Never Used  Substance and Sexual Activity  . Alcohol use: Yes    Comment: ocassionally  . Drug use: No  . Sexual activity: Not on file  Lifestyle  . Physical activity    Days per week: Not on file    Minutes per session: Not on file  . Stress: Not on file  Relationships  . Social Herbalist on phone: Not on file    Gets together: Not on file    Attends religious service: Not on file    Active member of club or organization: Not on file    Attends meetings of clubs or organizations: Not on file  Relationship status: Not on file  Other Topics Concern  . Not on file  Social History Narrative  . Not on file    Vitals:   05/13/19 0703  BP: 122/84  Pulse: 79  Resp: 12  Temp: 97.6 F (36.4 C)  SpO2: 96%   Body mass index is 32.37 kg/m.   Physical Exam  Nursing note reviewed. Constitutional: He is oriented to person, place, and time. He appears well-developed. No distress.  HENT:  Head: Normocephalic and atraumatic.  Mouth/Throat: Oropharynx is clear and moist and mucous membranes are normal.  Eyes: Pupils are equal, round, and reactive to light. Conjunctivae are normal.   Cardiovascular: Normal rate and regular rhythm.  No murmur heard. Respiratory: Effort normal and breath sounds normal. No respiratory distress.  GI: Soft. He exhibits no mass. There is no hepatomegaly. There is no abdominal tenderness.  Musculoskeletal:        General: No edema.  Lymphadenopathy:    He has no cervical adenopathy.  Neurological: He is alert and oriented to person, place, and time. He has normal strength. No cranial nerve deficit. Gait normal.  Reflex Scores:      Bicep reflexes are 2+ on the right side and 2+ on the left side.      Patellar reflexes are 2+ on the right side and 2+ on the left side. Skin: Skin is warm. No rash noted. No erythema.  Psychiatric: He has a normal mood and affect. Cognition and memory are normal.  Well groomed, good eye contact.    ASSESSMENT AND PLAN:  [name] was seen today for dry mouth and spasms.  Diagnoses and all orders for this visit:  Lab Results  Component Value Date   WBC 8.6 05/13/2019   HGB 14.5 05/13/2019   HCT 42.9 05/13/2019   MCV 93.1 05/13/2019   PLT 255.0 05/13/2019   Lab Results  Component Value Date   CREATININE 0.87 05/13/2019   BUN 16 05/13/2019   NA 138 05/13/2019   K 4.4 05/13/2019   CL 101 05/13/2019   CO2 29 05/13/2019   Lab Results  Component Value Date   CKTOTAL 111 05/13/2019    Muscle spasm Perioral, ? Fasciculations. He is concerned about a neurologic abnormality. Adequate hydration recommended. Instructed about warning signs. Further recommendations will be given according to lab results.  -     Basic metabolic panel -     CBC -     CK  Speech disturbance, unspecified type Reporting difficulty with speech associated with above problem. Neurologic examination today doe snot suggest a serious process. Head CT will be arranged.  -     CT Head Wo Contrast; Future  Dry mouth Possible causes discussed. OTC mouth wash may help. Recommend arranging appt with his dentist.  Need  for influenza vaccination -     Flu Vaccine QUAD 36+ mos IM    Return if symptoms worsen or fail to improve.    Jazmine Longshore G. [place], MD  [facility]. [place] office.

## 2019-05-13 NOTE — Patient Instructions (Signed)
A few things to remember from today's visit:   Muscle spasm - Plan: Basic metabolic panel, CBC, CK  Speech disturbance, unspecified type - Plan: CT Head Wo Contrast  Dry mouth  Please arrange appointment with dentist.  Please be sure medication list is accurate. If a new problem present, please set up appointment sooner than planned today.

## 2019-05-19 ENCOUNTER — Telehealth: Payer: Self-pay | Admitting: *Deleted

## 2019-05-19 ENCOUNTER — Inpatient Hospital Stay: Admission: RE | Admit: 2019-05-19 | Payer: PRIVATE HEALTH INSURANCE | Source: Ambulatory Visit

## 2019-05-19 NOTE — Telephone Encounter (Signed)
Tried calling patient, left message to return call to office. Copied from [place] [id number]. Topic: Quick Communication - See Telephone Encounter >> May 19, 2019  8:42 AM Loma Boston wrote: CRM for notification. See Telephone encounter for: 05/19/19. Please reach out to pt, states that he had called several times Fri. Was told that he had an imaging appt but did not know where and can not keep time of 11:45 for appt that was made for him. States does not really understand what they will be doing. Wants to talk to Dr Doug Sou nurse call bk to [phone]

## 2019-05-23 ENCOUNTER — Ambulatory Visit (INDEPENDENT_AMBULATORY_CARE_PROVIDER_SITE_OTHER)
Admission: RE | Admit: 2019-05-23 | Discharge: 2019-05-23 | Disposition: A | Discharge status: Home / Self Care | Payer: PRIVATE HEALTH INSURANCE | Source: Ambulatory Visit | Attending: Family Medicine | Admitting: Family Medicine

## 2019-05-23 ENCOUNTER — Other Ambulatory Visit: Payer: Self-pay

## 2019-05-23 DIAGNOSIS — R479 Unspecified speech disturbances: Secondary | ICD-10-CM | POA: Diagnosis not present

## 2019-07-08 ENCOUNTER — Telehealth: Payer: Self-pay

## 2019-07-08 NOTE — Telephone Encounter (Signed)
Copied from [place] [id number]. Topic: General - Other >> Jul 08, 2019  2:04 PM Leward Quan A wrote: Reason for CRM: Dr Hardie Shackleton nurse from the Oral Surgeon office called to say that the doctor would like to speak to Dr Martinique when she have a chance to give him a call. He states that even though the patient is not a candidate at this moment for oral surgery he would like to discuss some treatment options with her. Please call Dr Hardie Shackleton at Ph# [phone]

## 2019-07-14 NOTE — Telephone Encounter (Signed)
Returned phone call and office is closed. Left message. Betty Martinique, MD

## 2019-07-15 NOTE — Telephone Encounter (Signed)
Anderson Malta with Dr.Riggs office called again and was wondering if PCP and Dr.Riggs could coordinate a time to speak about pt for treatment plan. Anderson Malta states Dr.Riggs may be available to speak today either at 2:00 and possibly 4:30, or tomorrow around 11:30. Please advise and give a call back at [phone]. Anderson Malta did state last appointment of the day is at 4:30 and they do leave shortly after that.

## 2019-07-22 NOTE — Telephone Encounter (Signed)
Dr Hardie Shackleton cannot take my call at this time. He will be available in 10 min. I will try to call back. Sean Scalese [place], MD

## 2019-07-22 NOTE — Telephone Encounter (Signed)
Dr Franchot Heidelberg is with pt at this time. Will try again around 4:30 pm. Yazhini Mcaulay [place], MD

## 2019-07-22 NOTE — Telephone Encounter (Signed)
Anderson Malta with Dr. Hardie Shackleton office called again to request Dr. Martinique to return call to discuss treatment plan for pt. Please advise. CB#[phone]

## 2019-07-22 NOTE — Telephone Encounter (Signed)
Message Routed to PCP CMA 

## 2019-07-29 ENCOUNTER — Other Ambulatory Visit: Payer: Self-pay | Admitting: Family Medicine

## 2019-07-29 DIAGNOSIS — H02519 Abnormal innervation syndrome unspecified eye, unspecified eyelid: Secondary | ICD-10-CM

## 2019-07-29 NOTE — Progress Notes (Signed)
Spoke with Dr Hardie Shackleton (oral surgeon), who saw Sean Romero recently. He was referred to him because jaw issues by his general dentist. During visit Dr Hardie Shackleton noted involuntary jerking like mandible movement. Problem has been going on for 2-3 months. It seems to be worse at night and better in the morning. It did not improved with not wearing mask for a few weeks, so trigeminal pathology does not seem to be likely.  I saw him on 05/13/19 when he had similar concern. Blood work done on 05/13/2019 (BMP, CK, and CBC) was otherwise negative except for mildly elevated glucose, 118. Head CT done on 05/23/2019 was negative.  Because persistent problem, I am placing neurology referral to evaluate for possible neurologic disorder.  Sean Mausolf [place], MD

## 2019-07-29 NOTE — Telephone Encounter (Signed)
I had a conversation with Dr Hardie Shackleton about involuntary jaw movement, see order note. Because persistent problem, I placed neurology referral. Thanks, BJ

## 2019-07-30 ENCOUNTER — Other Ambulatory Visit: Payer: Self-pay

## 2019-07-30 ENCOUNTER — Encounter: Payer: Self-pay | Admitting: Neurology

## 2019-07-30 ENCOUNTER — Ambulatory Visit (INDEPENDENT_AMBULATORY_CARE_PROVIDER_SITE_OTHER): Payer: PRIVATE HEALTH INSURANCE | Admitting: Family Medicine

## 2019-07-30 ENCOUNTER — Encounter: Payer: Self-pay | Admitting: Family Medicine

## 2019-07-30 VITALS — BP 136/80 | HR 89 | Temp 95.9°F | Resp 16 | Ht 69.5 in | Wt 225.0 lb

## 2019-07-30 DIAGNOSIS — H02519 Abnormal innervation syndrome unspecified eye, unspecified eyelid: Secondary | ICD-10-CM | POA: Diagnosis not present

## 2019-07-30 DIAGNOSIS — H9201 Otalgia, right ear: Secondary | ICD-10-CM

## 2019-07-30 MED ORDER — GABAPENTIN 100 MG PO CAPS: 300.0000 mg | ORAL_CAPSULE | Freq: Every day | ORAL | 0 refills | Status: DC

## 2019-07-30 NOTE — Patient Instructions (Signed)
A few things to remember from today's visit:   Paradoxical facial movements, unspecified laterality - Plan: TSH, RPR  Encounter for screening for HIV - Plan: HIV antibody (with reflex)  Start taking gabapentin 100 mg at bedtime increase dose to 2 capsules 7 days later and then to 3 capsules after another 7 days. Goal is to take 300 mg at bedtime. Pending appointment with neurologist.  Please be sure medication list is accurate. If a new problem present, please set up appointment sooner than planned today.

## 2019-07-30 NOTE — Telephone Encounter (Signed)
I called and spoke with pt. He is aware of message below. He is thinking it may be more tmj than anything else. Offered pt an appointment today at 2:30, pt accepted & appt scheduled.

## 2019-07-30 NOTE — Progress Notes (Signed)
HPI:   Sean Romero is a 48 y.o. male, who is here today to follow on recent OV.   He was last seen on 05/13/19,when he was c/o intermittent spasmus like jaw movement that was worse when talking and chewing. In order to relieve discomfort he has to open mouth and stretch muscles, so food comes out from his mouth. Also his wife has noted drooling and "spitting" without him notice it. He has not noted pain.  No new meds or Hx of trauma.  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  He is frustrated, has had some embarrassed situations when eating with friends and family. He has not noted associated confusion or urine/bowel dysfunction during episodes.  Problem has been getting worse since last OV.  It seems to be better in the morning and worse at the end of the day. No associated fever,chills,fatigue,headache,visual changes,dysphagia,stridor,coughm,wheezing,or dyspnea.  He has seen Dr Hardie Shackleton, oral surgeon.   Head CT done on 05/23/19: Negative.  Lab Results  Component Value Date   WBC 8.6 05/13/2019   HGB 14.5 05/13/2019   HCT 42.9 05/13/2019   MCV 93.1 05/13/2019   PLT 255.0 05/13/2019   Lab Results  Component Value Date   CKTOTAL 111 05/13/2019   Lab Results  Component Value Date   CREATININE 0.87 05/13/2019   BUN 16 05/13/2019   NA 138 05/13/2019   K 4.4 05/13/2019   CL 101 05/13/2019   CO2 29 05/13/2019    I spoke with Dr Hardie Shackleton a few days ago, he noted "jerking" involuntary movement. He did not think problem was caused by dental issues or TMJ.  He does not feel like problem is aggravated by stress or anxiety. Problem did not improve after stopping wearing the mask.   3 days ago he started with right earache, sharp, intermittent,and last a few seconds. He has not identified exacerbating or alleviating factors. Negative for ear drainage,changes in hearing,or sore throat. No recent  URI. Negative for changes in smell or taste. He has not tried OTC medication.   Review of Systems  Constitutional: Negative for appetite change, diaphoresis and unexpected weight change.  HENT: Negative for congestion and mouth sores.   Eyes: Negative for photophobia and pain.  Gastrointestinal: Negative for abdominal pain, nausea and vomiting.  Endocrine: Negative for cold intolerance, heat intolerance, polydipsia, polyphagia and polyuria.  Musculoskeletal: Negative for arthralgias, gait problem and neck pain.  Skin: Negative for rash and wound.  Neurological: Negative for tremors and facial asymmetry.  Hematological: Negative for adenopathy. Does not bruise/bleed easily.  Psychiatric/Behavioral: Negative for confusion. The patient is nervous/anxious.   Rest see pertinent positives and negatives per HPI.   Current Outpatient Medications on File Prior to Visit  Medication Sig Dispense Refill  . fluticasone (FLONASE) 50 MCG/ACT nasal spray SHAKE LIQUID AND USE 1 SPRAY IN EACH NOSTRIL TWICE DAILY 16 g 3  . tamsulosin (FLOMAX) 0.4 MG CAPS capsule Take 1 capsule (0.4 mg total) by mouth daily. 90 capsule 2   No current facility-administered medications on file prior to visit.     Past Medical History:  Diagnosis Date  . Asthma   . Hypertension    Allergies  Allergen Reactions  . Penicillins Rash    Social History   Socioeconomic History  . Marital status: Single    Spouse name: Not on file  . Number of children: Not on file  .  Years of education: Not on file  . Highest education level: Not on file  Occupational History  . Not on file  Tobacco Use  . Smoking status: Never Smoker  . Smokeless tobacco: Never Used  Substance and Sexual Activity  . Alcohol use: Yes    Comment: ocassionally  . Drug use: No  . Sexual activity: Not on file  Other Topics Concern  . Not on file  Social History Narrative  . Not on file   Social Determinants of Health   Financial  Resource Strain:   . Difficulty of Paying Living Expenses: Not on file  Food Insecurity:   . Worried About Programme researcher, broadcasting/film/videounning Out of Food in the Last Year: Not on file  . Ran Out of Food in the Last Year: Not on file  Transportation Needs:   . Lack of Transportation (Medical): Not on file  . Lack of Transportation (Non-Medical): Not on file  Physical Activity:   . Days of Exercise per Week: Not on file  . Minutes of Exercise per Session: Not on file  Stress:   . Feeling of Stress : Not on file  Social Connections:   . Frequency of Communication with Friends and Family: Not on file  . Frequency of Social Gatherings with Friends and Family: Not on file  . Attends Religious Services: Not on file  . Active Member of Clubs or Organizations: Not on file  . Attends BankerClub or Organization Meetings: Not on file  . Marital Status: Not on file    Vitals:   07/30/19 1424  BP: 136/80  Pulse: 89  Resp: 16  Temp: (!) 95.9 F (35.5 C)  SpO2: 98%   Body mass index is 32.75 kg/m.  Physical Exam  Nursing note and vitals reviewed. Constitutional: He is oriented to person, place, and time. He appears well-developed. He does not appear ill. No distress.  HENT:  Head: Normocephalic and atraumatic.  Right Ear: External ear and ear canal normal. A middle ear effusion is present.  Left Ear: Tympanic membrane, external ear and ear canal normal.  Nose: No rhinorrhea. Right sinus exhibits no maxillary sinus tenderness and no frontal sinus tenderness. Left sinus exhibits no maxillary sinus tenderness and no frontal sinus tenderness.  Mouth/Throat: Oropharynx is clear and moist and mucous membranes are normal.  Hypertrophic turbinates.  Eyes: Pupils are equal, round, and reactive to light. Conjunctivae and EOM are normal.  Cardiovascular: Normal rate and regular rhythm.  No murmur heard. Respiratory: Effort normal and breath sounds normal. No respiratory distress.  Lymphadenopathy:       Head (right side): No  submandibular adenopathy present.       Head (left side): No submandibular adenopathy present.    He has no cervical adenopathy.  Neurological: He is alert and oriented to person, place, and time. He has normal strength. He displays no tremor. No cranial nerve deficit. Gait normal.  During OV and while he is talking I noted perioral muscular involuntary movement.  Skin: Skin is warm. No rash noted. No erythema.  Psychiatric: His mood appears anxious.  Well groomed, good eye contact.    ASSESSMENT AND PLAN:  [name] was seen today for temporomandibular joint pain.  Diagnoses and all orders for this visit:  Lab Results  Component Value Date   TSH 1.22 07/30/2019    Paradoxical facial movements, unspecified laterality Possible etiologies discuss. No associated MS changes, so seizure disorder is unlikely. ? Tardive dyskinesia, tics, extrapyramidal disorder. We discussed options in  regard to pharmacologic treatment, SSRI vs Gabapentin,he agrees with trying the latter one. Gabapentin to start with 100 mg and titrate to 300 mg as tolerated. Side effects discussed.  Neuro referral has been placed. Refused HIV test.   -     TSH -     RPR -     gabapentin (NEURONTIN) 100 MG capsule; Take 3 capsules (300 mg total) by mouth at bedtime.  Earache, right Ear examination otherwise negative. ? Eustachian tub dysfunction. Auto inflation maneuvers a few times during the day recommended. Flonase nasal spray daily for a couple weeks.   Return in about 3 weeks (around 08/20/2019).    Natosha Bou G. [place], MD  [facility]. [place] office.

## 2019-07-31 LAB — RPR: RPR Ser Ql: NONREACTIVE

## 2019-07-31 LAB — TSH: TSH: 1.22 u[IU]/mL (ref 0.35–4.50)

## 2019-08-26 NOTE — Progress Notes (Signed)
Sean Romero was seen today in neurologic consultation at the request of [place], Timoteo Expose, MD.  The consultation is for the evaluation of "paradoxical facial movements."  Medical records made available to me are reviewed.  Patient was seen at primary care office on May 13, 2019 for what is described as spasms and twitching around the mouth.  Head CT was ordered after this.  I personally reviewed and interpreted this.  It was normal.  Patient apparently saw the dentist and oral surgeon.  During the visit, the oral surgeon noted involuntary "jerking like mandible movement."  He followed up with his primary care on July 30, 2019.  She started the patient on gabapentin for the movements.  Pt states that he started with abnormal movement in the jaw in late august or September.  States that it makes him tired at the end of the day because jaw is moving all day.  Tongue doesn't move out of mouth.  Eyes don't forcefully close spontaneously.  Takes him longer to eat but no swallow trouble.  States that wife noted that doesn't happen during sex.    Patient denies exposure to antipsychotic medication.  Denies exposure to metoclopramide.  Denies being on chronic antinausea medication.   ALLERGIES:   Allergies  Allergen Reactions  . Penicillins Rash    CURRENT MEDICATIONS:  Outpatient Encounter Medications as of 09/01/2019  Medication Sig  . fluticasone (FLONASE) 50 MCG/ACT nasal spray SHAKE LIQUID AND USE 1 SPRAY IN EACH NOSTRIL TWICE DAILY  . gabapentin (NEURONTIN) 100 MG capsule Take 3 capsules (300 mg total) by mouth at bedtime.  . tamsulosin (FLOMAX) 0.4 MG CAPS capsule Take 1 capsule (0.4 mg total) by mouth daily.  . [DISCONTINUED] tamsulosin (FLOMAX) 0.4 MG CAPS capsule Take 1 capsule (0.4 mg total) by mouth daily.   No facility-administered encounter medications on file as of 09/01/2019.    PHYSICAL EXAMINATION:    VITALS:   Vitals:   09/01/19 1017  BP: 129/83  Pulse: 78  Resp: 16   SpO2: 98%  Weight: 222 lb 6.4 oz (100.9 kg)  Height: 5' 9.5" (1.765 m)    GEN:  Normal appears male in no acute distress.  Appears stated age. HEENT:  Normocephalic, atraumatic. The mucous membranes are moist. The superficial temporal arteries are without ropiness or tenderness. Cardiovascular: Regular rate and rhythm. Lungs: Clear to auscultation bilaterally. Neck/Heme: There are no carotid bruits noted bilaterally.  NEUROLOGICAL: Orientation:  The patient is alert and oriented x 3.   Cranial nerves: There is good facial symmetry.  Extraocular muscles are intact and visual fields are full to confrontational testing. Speech is fluent and clear. Soft palate rises symmetrically and there is no tongue deviation. Hearing is intact to conversational tone. Tone: Tone is good throughout. Sensation: Sensation is intact to light touch and pinprick throughout (facial, trunk, extremities). Vibration is intact at the bilateral big toe. There is no extinction with double simultaneous stimulation. There is no sensory dermatomal level identified. Coordination:  The patient has no difficulty with RAM's or FNF bilaterally. Motor: Strength is 5/5 in the bilateral upper and lower extremities.  Shoulder shrug is equal and symmetric. There is no pronator drift.  There are no fasciculations noted. DTR's: Deep tendon reflexes are 2/4 at the bilateral biceps, triceps, brachioradialis, patella and achilles.  Plantar responses are downgoing bilaterally. Gait and Station: The patient is able to ambulate without difficulty. The patient is able to heel toe walk without any difficulty. The patient  is able to ambulate in a tandem fashion. The patient is able to stand in the Romberg position. Abnormal movements:  Jaw frequently thrust open.  Tongue in mouth.  ?movements of neck but unsure if related to thrusting open of jaw.  No abnormal movements of orbicularis oculi  I have reviewed and interpreted the following labs  independently   Chemistry      Component Value Date/Time   NA 138 05/13/2019 0723   K 4.4 05/13/2019 0723   CL 101 05/13/2019 0723   CO2 29 05/13/2019 0723   BUN 16 05/13/2019 0723   CREATININE 0.87 05/13/2019 0723      Component Value Date/Time   CALCIUM 9.5 05/13/2019 0723   ALKPHOS 62 08/20/2018 0839   AST 13 08/20/2018 0839   ALT 15 08/20/2018 0839   BILITOT 0.6 08/20/2018 0839     Lab Results  Component Value Date   TSH 1.22 07/30/2019      IMPRESSION/PLAN  1. Jaw opening dystonia  -Patient and I discussed nature and pathophysiology.  Discussed that there is really no cure.  Discussed sensory tricks, such as toothpick between the teeth.  Discussed that the brain can get used to these with time and require alternative sensory tricks.  Discussed Botox.  Discussed that this is generally done by ENT because of the sensitive nature of jaw opening dystonia.  He asked about risk, benefits, and side effects of Botox and we discussed these, including the black box warning.  Discussed that he may have a component of Meige syndrome, but difficult to tell today.  He certainly did not have a component of blepharospasm.  He has questions and I answered those to the best of my ability today.  I will try to find an ENT to adequately work with him.  He will follow-up here on an as-needed basis.   MDM Number of Diagnoses or Management Options Dystonia: new, no workup   Amount and/or Complexity of Data Reviewed Clinical lab tests: reviewed Tests in the radiology section of CPT: reviewed Review and summarize past medical records: yes Independent visualization of images, tracings, or specimens: yes  Risk of Complications, Morbidity, and/or Mortality Presenting problems: moderate Management options: moderate    Cc:  Martinique, Betty G, MD

## 2019-08-29 ENCOUNTER — Other Ambulatory Visit: Payer: Self-pay

## 2019-08-29 DIAGNOSIS — N401 Enlarged prostate with lower urinary tract symptoms: Secondary | ICD-10-CM

## 2019-08-29 MED ORDER — TAMSULOSIN HCL 0.4 MG PO CAPS: 0.4000 mg | ORAL_CAPSULE | Freq: Every day | ORAL | 1 refills | Status: DC

## 2019-09-01 ENCOUNTER — Ambulatory Visit: Payer: PRIVATE HEALTH INSURANCE | Admitting: Neurology

## 2019-09-01 ENCOUNTER — Ambulatory Visit (INDEPENDENT_AMBULATORY_CARE_PROVIDER_SITE_OTHER): Payer: BC Managed Care – PPO | Admitting: Neurology

## 2019-09-01 ENCOUNTER — Encounter: Payer: Self-pay | Admitting: Neurology

## 2019-09-01 ENCOUNTER — Other Ambulatory Visit: Payer: Self-pay

## 2019-09-01 VITALS — BP 129/83 | HR 78 | Resp 16 | Ht 69.5 in | Wt 222.4 lb

## 2019-09-01 DIAGNOSIS — G249 Dystonia, unspecified: Secondary | ICD-10-CM

## 2019-09-01 NOTE — Patient Instructions (Signed)
You have jaw opening dystonia.  I will research to see if any of the ENT physicians do botox for this in the area.  If not, I will refer you to Dr. Vergie Living at [facility]

## 2019-09-05 ENCOUNTER — Telehealth: Payer: Self-pay | Admitting: Neurology

## 2019-09-05 DIAGNOSIS — G249 Dystonia, unspecified: Secondary | ICD-10-CM

## 2019-09-05 NOTE — Telephone Encounter (Signed)
Let pt know that I have asked around and, as suspected, the closest place to get botox is with Dr. Vergie Living at [facility].  Please refer if patient agreeable.  He is at [facility].  Dx:  Jaw opening dystonia.  Referral is for botox evaluation

## 2019-09-08 NOTE — Telephone Encounter (Signed)
I don't know of any that work for this, unfortunately.  I discussed this with him at the visit.  Many have been tried but don't really work

## 2019-09-08 NOTE — Telephone Encounter (Signed)
Patient stated that he would like to discuss alternative treatments besides the botox. He would like to discuss these with you, prior to a referral.

## 2019-09-08 NOTE — Telephone Encounter (Signed)
Pt is aware and referral placed. 

## 2019-09-11 DIAGNOSIS — G244 Idiopathic orofacial dystonia: Secondary | ICD-10-CM | POA: Diagnosis not present

## 2019-09-11 DIAGNOSIS — Z6830 Body mass index (BMI) 30.0-30.9, adult: Secondary | ICD-10-CM | POA: Diagnosis not present

## 2019-10-08 DIAGNOSIS — G244 Idiopathic orofacial dystonia: Secondary | ICD-10-CM | POA: Diagnosis not present

## 2019-10-17 ENCOUNTER — Ambulatory Visit: Payer: BC Managed Care – PPO

## 2019-12-31 DIAGNOSIS — G259 Extrapyramidal and movement disorder, unspecified: Secondary | ICD-10-CM | POA: Diagnosis not present

## 2019-12-31 DIAGNOSIS — G244 Idiopathic orofacial dystonia: Secondary | ICD-10-CM | POA: Diagnosis not present

## 2019-12-31 DIAGNOSIS — M2689 Other dentofacial anomalies: Secondary | ICD-10-CM | POA: Diagnosis not present

## 2020-01-08 DIAGNOSIS — Z6830 Body mass index (BMI) 30.0-30.9, adult: Secondary | ICD-10-CM | POA: Diagnosis not present

## 2020-01-08 DIAGNOSIS — G244 Idiopathic orofacial dystonia: Secondary | ICD-10-CM | POA: Diagnosis not present

## 2020-01-08 DIAGNOSIS — G245 Blepharospasm: Secondary | ICD-10-CM | POA: Diagnosis not present

## 2020-02-25 ENCOUNTER — Other Ambulatory Visit: Payer: Self-pay | Admitting: Family Medicine

## 2020-02-25 DIAGNOSIS — N401 Enlarged prostate with lower urinary tract symptoms: Secondary | ICD-10-CM

## 2020-03-31 DIAGNOSIS — G245 Blepharospasm: Secondary | ICD-10-CM | POA: Diagnosis not present

## 2020-03-31 DIAGNOSIS — G244 Idiopathic orofacial dystonia: Secondary | ICD-10-CM | POA: Diagnosis not present

## 2020-04-21 DIAGNOSIS — M2651 Abnormal jaw closure: Secondary | ICD-10-CM | POA: Diagnosis not present

## 2020-04-21 DIAGNOSIS — G244 Idiopathic orofacial dystonia: Secondary | ICD-10-CM | POA: Diagnosis not present

## 2020-04-21 DIAGNOSIS — Z79899 Other long term (current) drug therapy: Secondary | ICD-10-CM | POA: Diagnosis not present

## 2020-05-13 DIAGNOSIS — M2651 Abnormal jaw closure: Secondary | ICD-10-CM | POA: Diagnosis not present

## 2020-05-13 DIAGNOSIS — G249 Dystonia, unspecified: Secondary | ICD-10-CM | POA: Diagnosis not present

## 2020-06-01 IMAGING — CT CT CERVICAL SPINE W/O CM
3 of 4 series · 13 of 33 positions shown, 16 images · non-contrast
Comparison: None.

CLINICAL DATA: Right shoulder/scapular pain beginning 2 days ago.
No known injury.

EXAM:
CT CERVICAL SPINE WITHOUT CONTRAST
TECHNIQUE: Multidetector CT imaging of the cervical spine was performed without
intravenous contrast. Multiplanar CT image reconstructions were also
generated.

[Series 5: c_spine 2.0 st · axial · 0.37mm/px · z∈[-170,-58]mm · 5 of 85 slices shown, 7 images]
[im 15/85  soft-tissue]
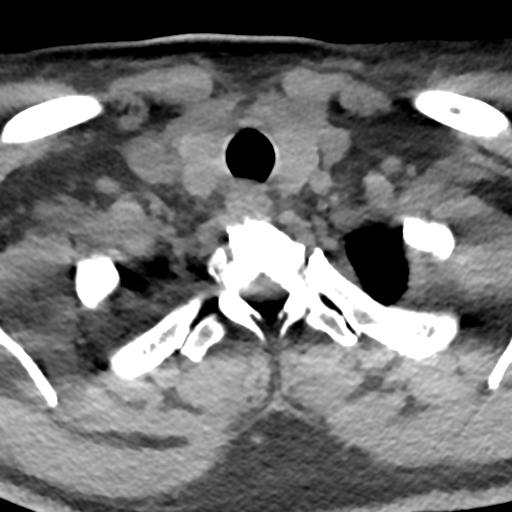
[im 15/85  bone]
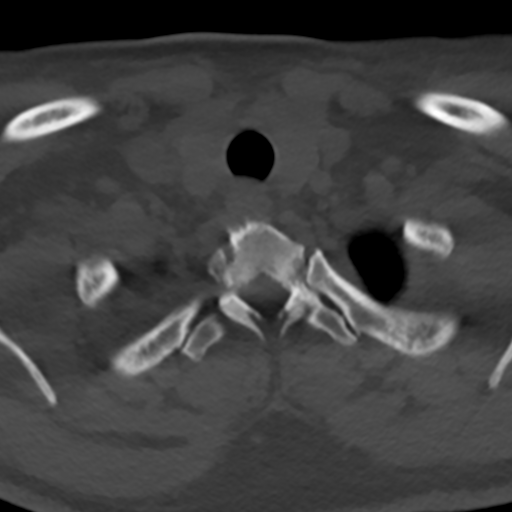
[im 29/85  bone]
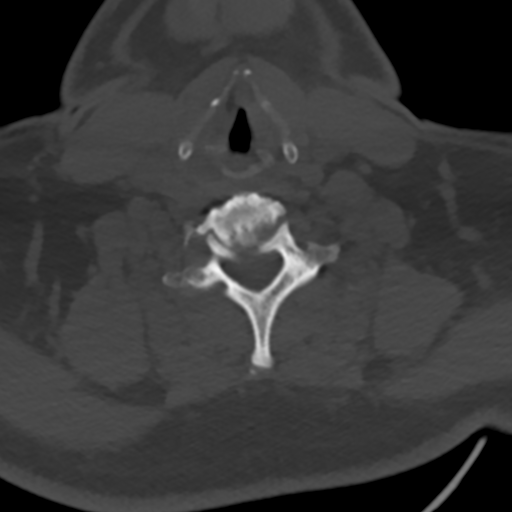
[im 43/85  bone]
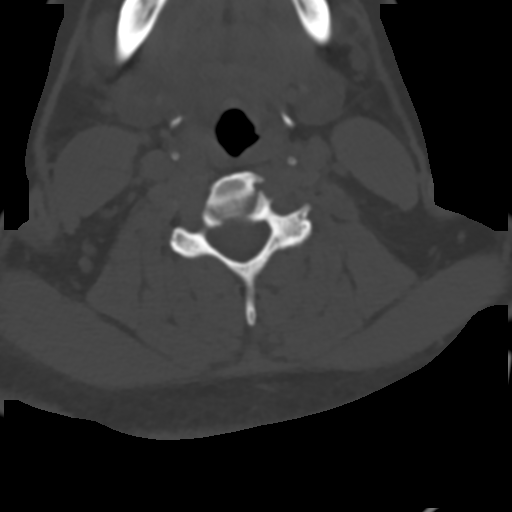
[im 57/85  bone]
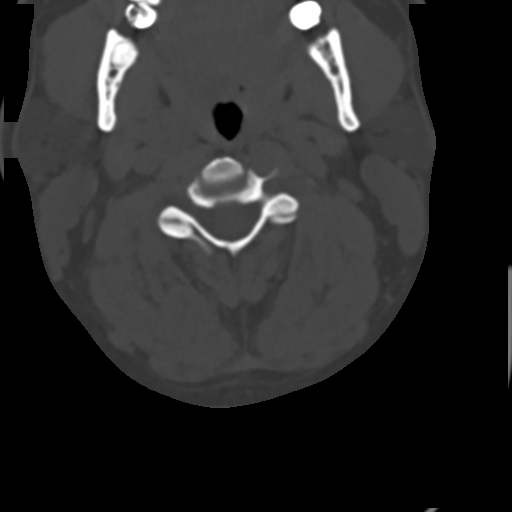
[im 71/85  soft-tissue]
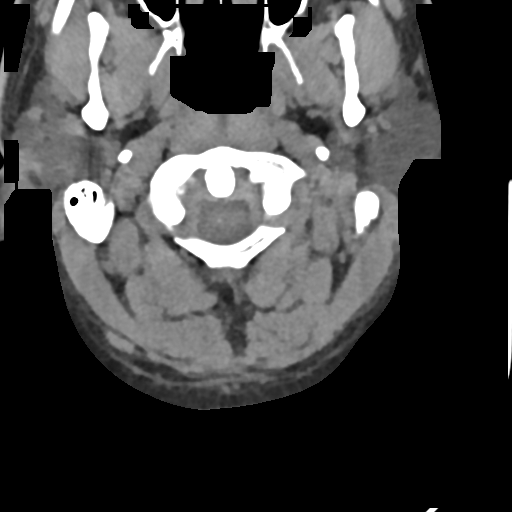
[im 71/85  bone]
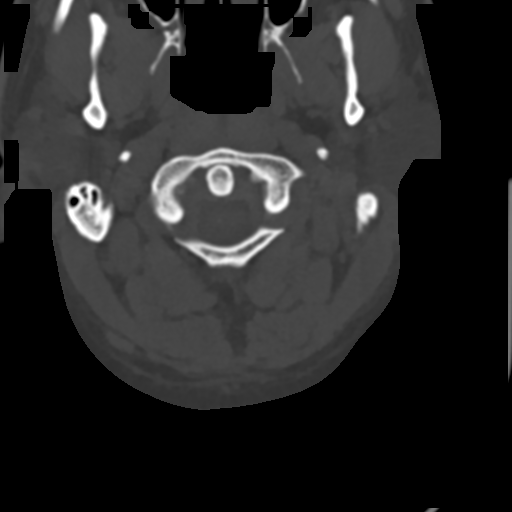

[Series 10: coronal bone · coronal · 0.25mm/px · 3 of 72 slices shown]
[im 15/72  bone]
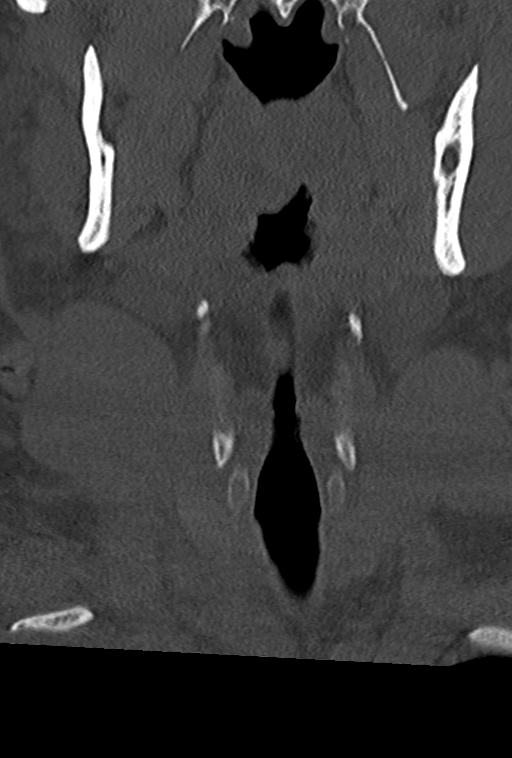
[im 29/72  bone]
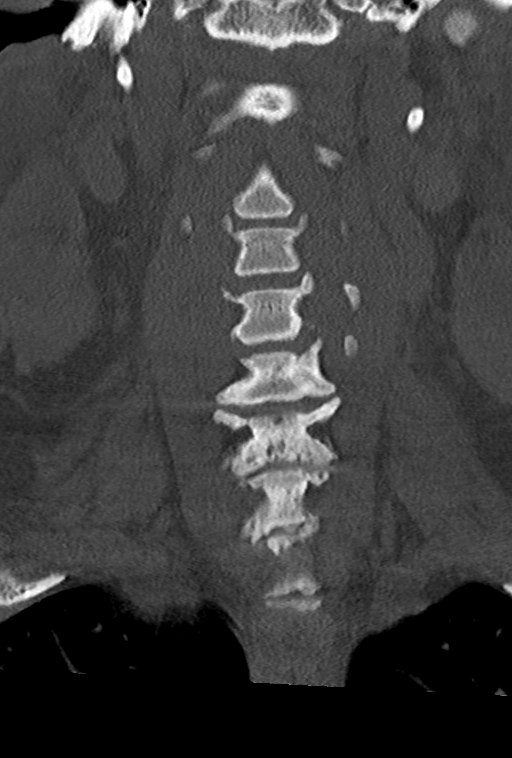
[im 43/72  bone]
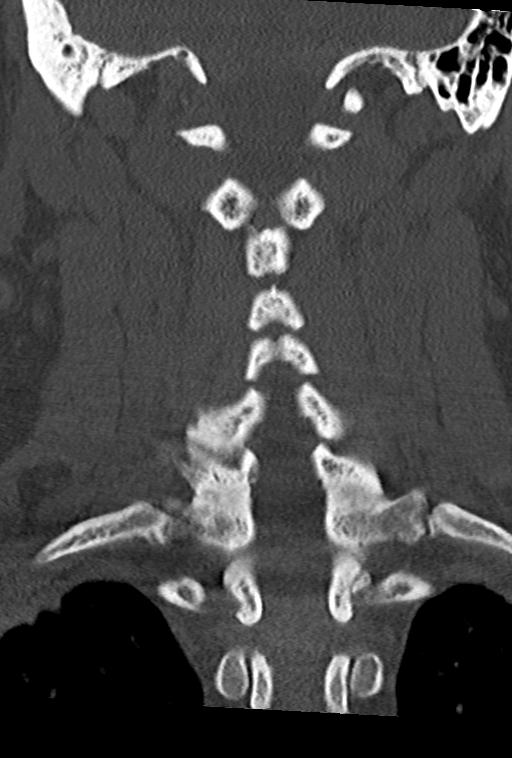

[Series 11: sagittal bone · sagittal · 0.28mm/px · 5 of 64 slices shown, 6 images]
[im 22/64  bone]
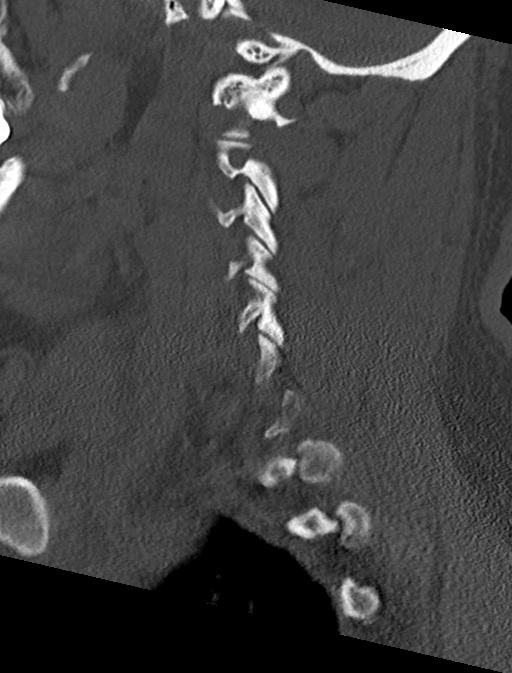
[im 27/64  bone]
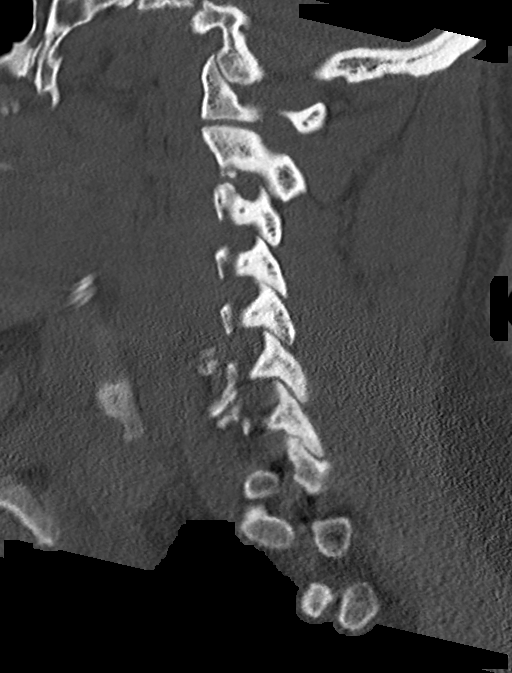
[im 32/64  soft-tissue]
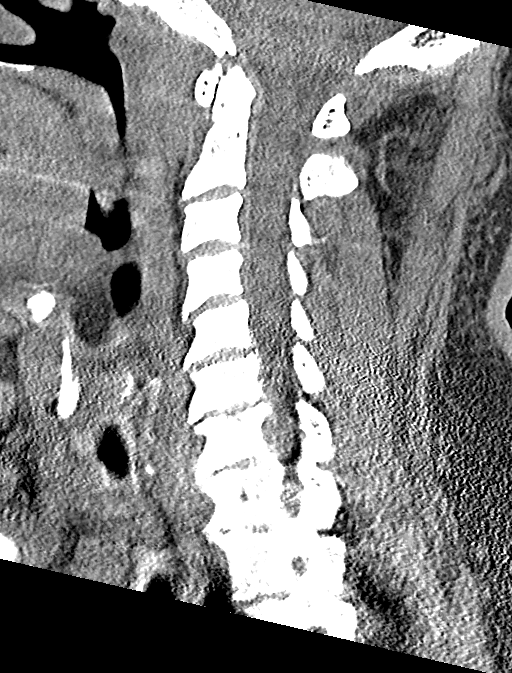
[im 32/64  bone]
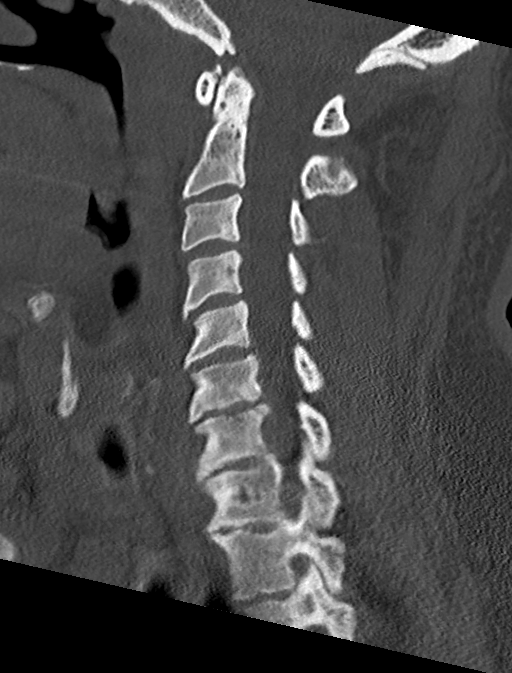
[im 37/64  bone]
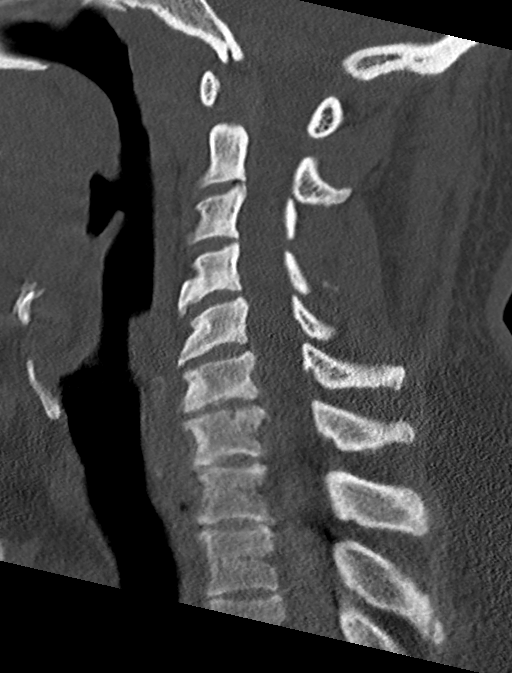
[im 43/64  bone]
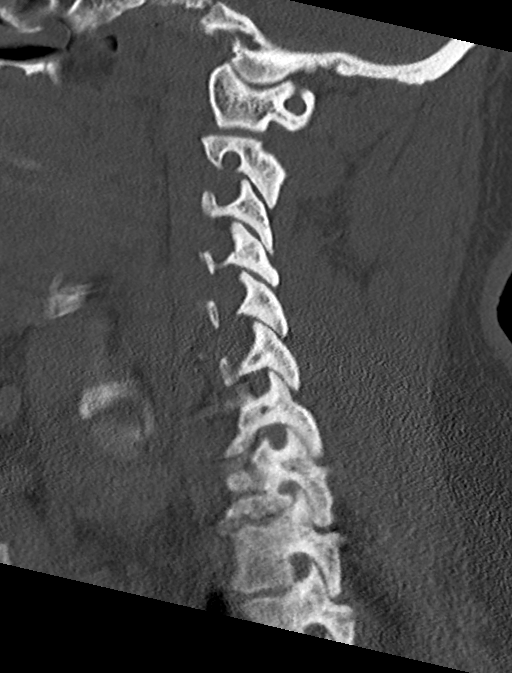

[13 of 33 positions shown; findings below may reference images not displayed]

FINDINGS: Alignment: Normal

Skull base and vertebrae: No fracture or primary bone lesion.

Soft tissues and spinal canal: No soft tissue lesion.

Disc levels: No abnormality at the foramen magnum. C1-2 shows
ordinary osteoarthritis but no encroachment upon the neural spaces.

C2-3: Mild uncovertebral hypertrophy.  No significant stenosis.

C3-4: Mild uncovertebral hypertrophy.  No significant stenosis.

C4-5: Mild bulging of the disc.  No compressive stenosis.

C5-6: Mild bulging of the disc.  No compressive stenosis.

C6-7: Spondylosis with endplate and uncovertebral osteophytes. Mild
bony foraminal narrowing without apparent compressive stenosis.

C7-T1: Mild endplate and uncovertebral osteophyte formation. No
apparent compressive stenosis. Mild facet osteoarthritis without
slippage or encroachment.

Upper chest: Negative

Other: None
IMPRESSION: Degenerative spondylosis throughout the cervical region as outlined
above. No evidence of apparently compressive stenosis of the canal
or foramina.

## 2020-06-01 IMAGING — DX DG CHEST 2V
2 series · 2 of 2 positions shown · non-contrast
Comparison: None.

CLINICAL DATA: Right-sided chest pain

EXAM:
CHEST - 2 VIEW

[chest pa]
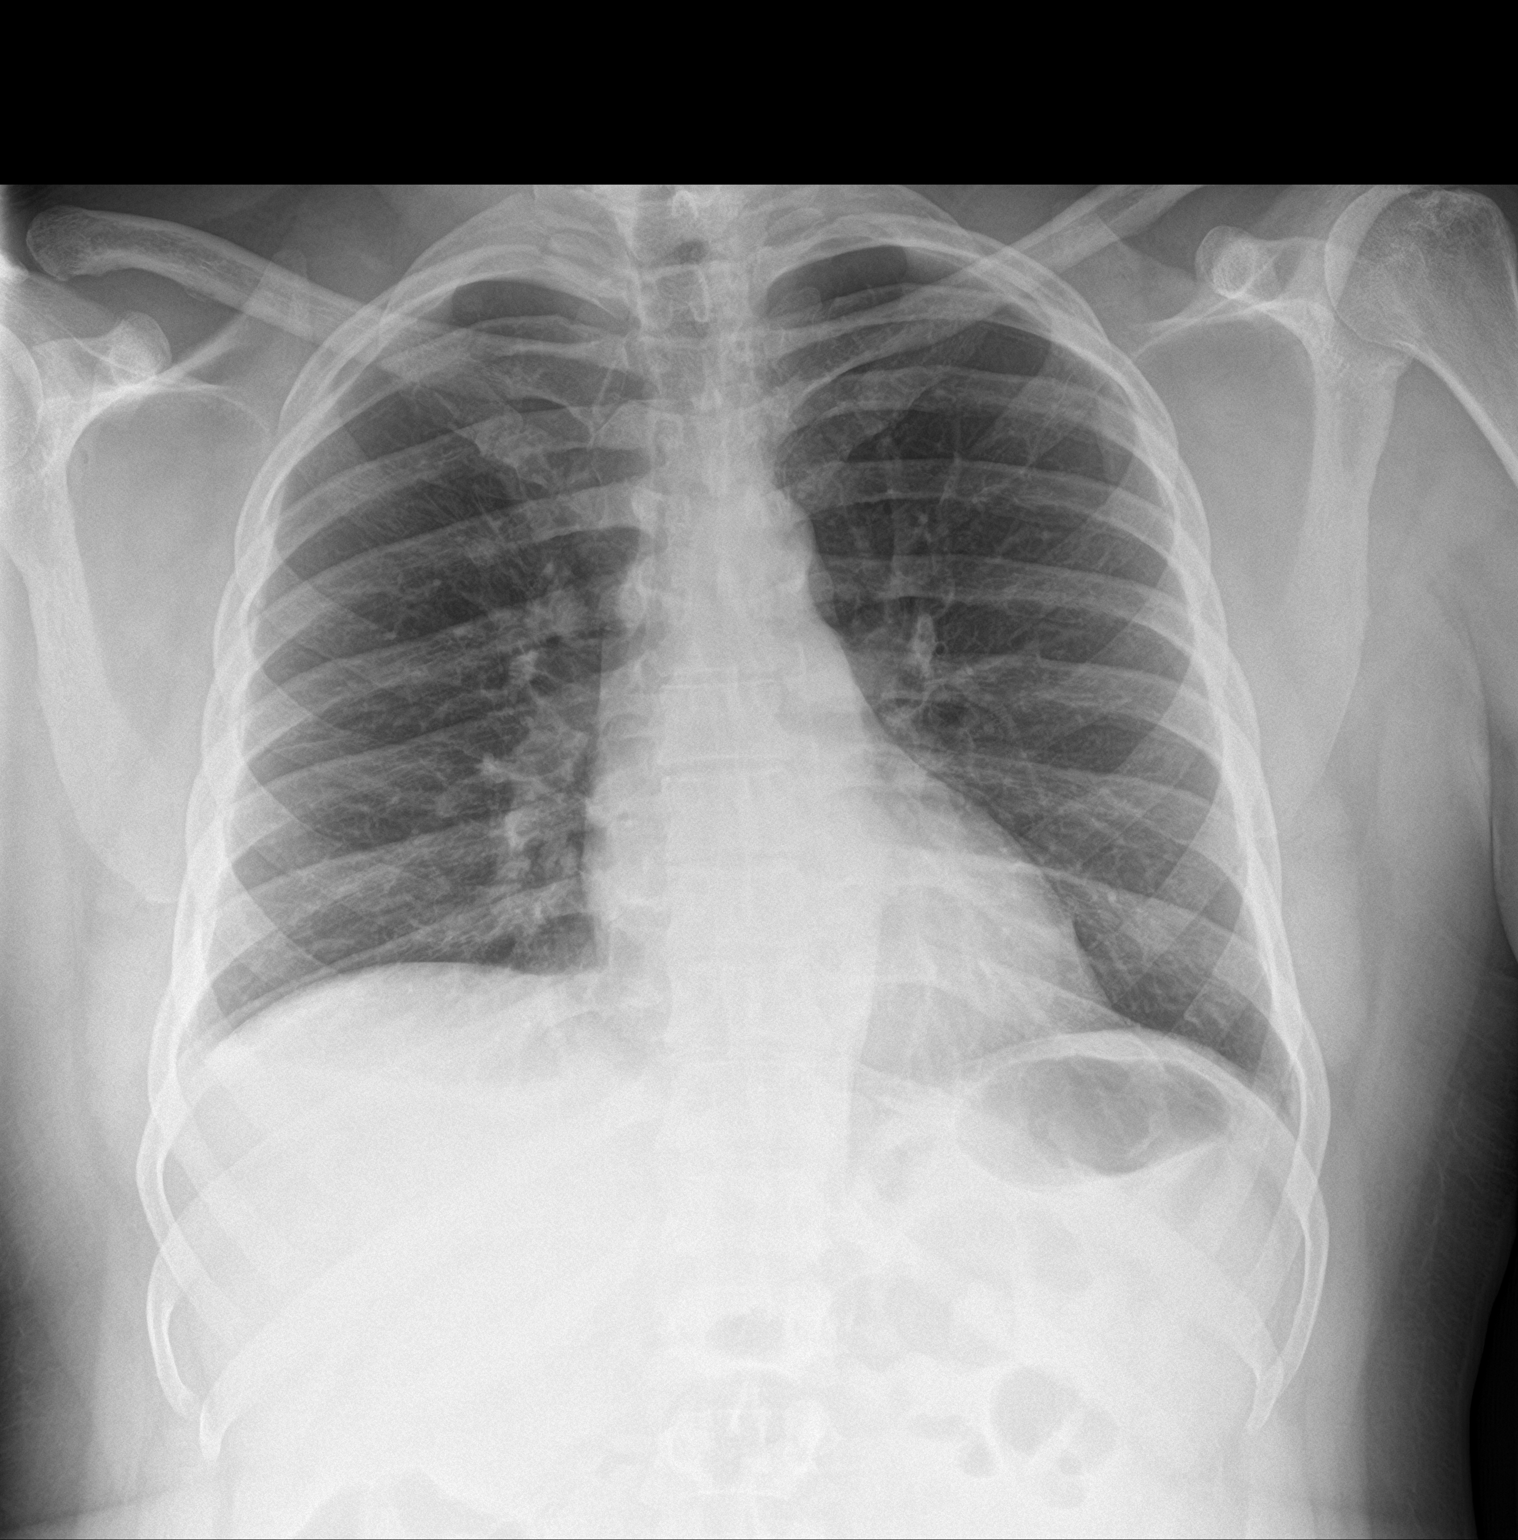

[chest lat]
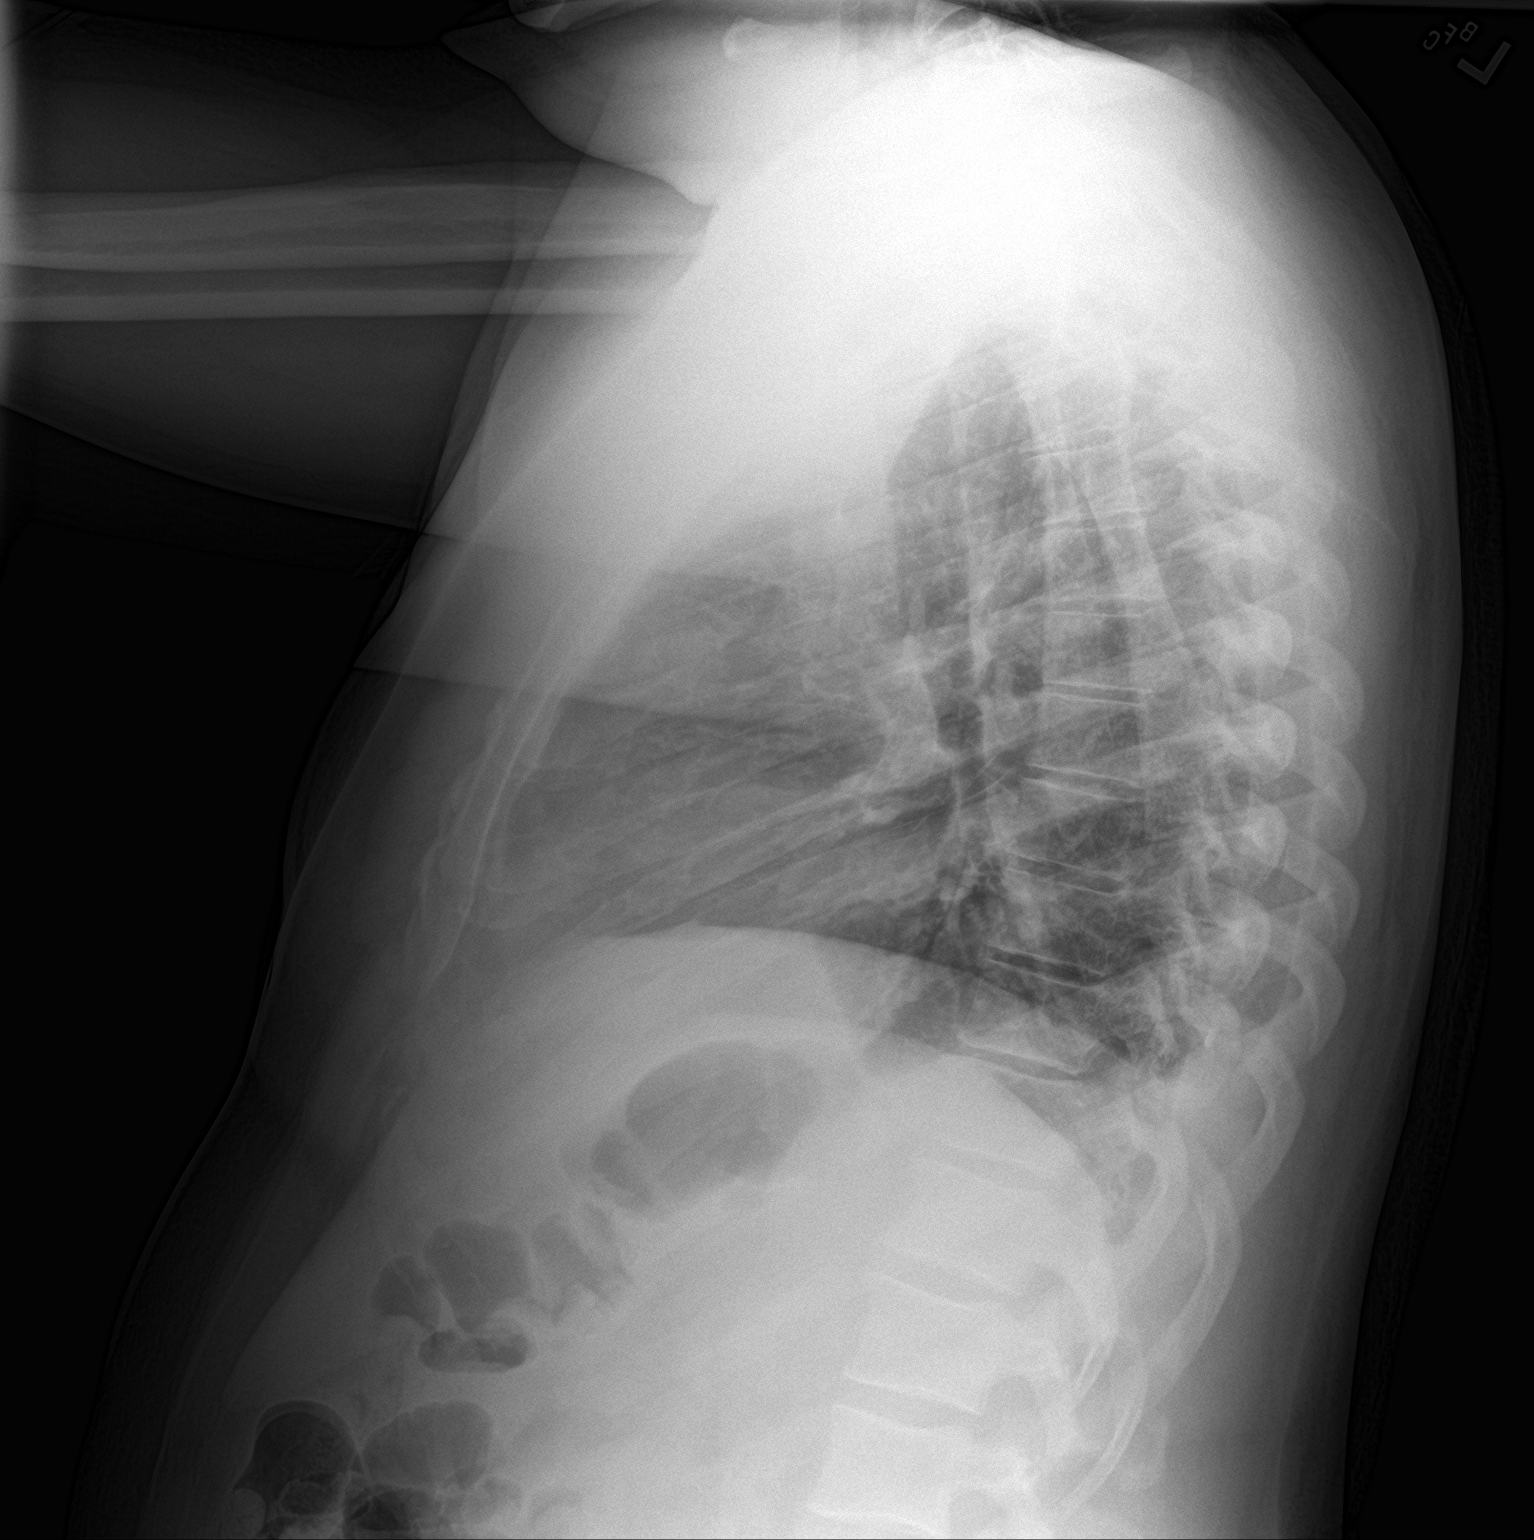

[2 of 2 positions shown; findings below may reference images not displayed]

FINDINGS: The heart size and mediastinal contours are within normal limits.
Both lungs are clear. The visualized skeletal structures are
unremarkable.
IMPRESSION: No active cardiopulmonary disease.

## 2020-06-22 ENCOUNTER — Ambulatory Visit: Payer: BC Managed Care – PPO | Attending: Internal Medicine

## 2020-06-22 DIAGNOSIS — Z23 Encounter for immunization: Secondary | ICD-10-CM

## 2020-06-22 NOTE — Progress Notes (Signed)
   Covid-19 Vaccination Clinic  Name:  Sean Romero    MRN: [id number] DOB: 10/14/1970  06/22/2020  Mr. Dollard was observed post Covid-19 immunization for 15 minutes without incident. He was provided with Vaccine Information Sheet and instruction to access the V-Safe system.   Mr. Prien was instructed to call 911 with any severe reactions post vaccine: Marland Kitchen Difficulty breathing  . Swelling of face and throat  . A fast heartbeat  . A bad rash all over body  . Dizziness and weakness   Immunizations Administered    Name Date Dose VIS Date Route   Pfizer COVID-19 Vaccine 06/22/2020  5:14 PM 0.3 mL 05/26/2020 Intramuscular   Manufacturer: [employer], [employer]   Lot: [id number]   NDC: [address]-9

## 2020-06-23 DIAGNOSIS — M2651 Abnormal jaw closure: Secondary | ICD-10-CM | POA: Diagnosis not present

## 2020-06-23 DIAGNOSIS — G244 Idiopathic orofacial dystonia: Secondary | ICD-10-CM | POA: Diagnosis not present

## 2020-07-21 DIAGNOSIS — G249 Dystonia, unspecified: Secondary | ICD-10-CM | POA: Diagnosis not present

## 2020-07-21 DIAGNOSIS — M2651 Abnormal jaw closure: Secondary | ICD-10-CM | POA: Diagnosis not present

## 2020-08-11 ENCOUNTER — Encounter: Payer: Self-pay | Admitting: Family Medicine

## 2020-08-11 ENCOUNTER — Ambulatory Visit (INDEPENDENT_AMBULATORY_CARE_PROVIDER_SITE_OTHER): Payer: BC Managed Care – PPO | Admitting: Family Medicine

## 2020-08-11 ENCOUNTER — Other Ambulatory Visit: Payer: Self-pay

## 2020-08-11 ENCOUNTER — Ambulatory Visit (INDEPENDENT_AMBULATORY_CARE_PROVIDER_SITE_OTHER): Payer: BC Managed Care – PPO

## 2020-08-11 VITALS — BP 122/70 | HR 89 | Temp 98.7°F | Resp 18 | Ht 70.0 in | Wt 219.2 lb

## 2020-08-11 DIAGNOSIS — Z1159 Encounter for screening for other viral diseases: Secondary | ICD-10-CM

## 2020-08-11 DIAGNOSIS — Z0000 Encounter for general adult medical examination without abnormal findings: Secondary | ICD-10-CM

## 2020-08-11 DIAGNOSIS — E785 Hyperlipidemia, unspecified: Secondary | ICD-10-CM

## 2020-08-11 DIAGNOSIS — N401 Enlarged prostate with lower urinary tract symptoms: Secondary | ICD-10-CM | POA: Diagnosis not present

## 2020-08-11 DIAGNOSIS — G8929 Other chronic pain: Secondary | ICD-10-CM

## 2020-08-11 DIAGNOSIS — Z130 Encounter for screening for diseases of the blood and blood-forming organs and certain disorders involving the immune mechanism: Secondary | ICD-10-CM

## 2020-08-11 DIAGNOSIS — R200 Anesthesia of skin: Secondary | ICD-10-CM

## 2020-08-11 DIAGNOSIS — M25511 Pain in right shoulder: Secondary | ICD-10-CM | POA: Diagnosis not present

## 2020-08-11 DIAGNOSIS — Z13228 Encounter for screening for other metabolic disorders: Secondary | ICD-10-CM

## 2020-08-11 DIAGNOSIS — R351 Nocturia: Secondary | ICD-10-CM

## 2020-08-11 DIAGNOSIS — Z1211 Encounter for screening for malignant neoplasm of colon: Secondary | ICD-10-CM

## 2020-08-11 DIAGNOSIS — Z1329 Encounter for screening for other suspected endocrine disorder: Secondary | ICD-10-CM

## 2020-08-11 DIAGNOSIS — G5603 Carpal tunnel syndrome, bilateral upper limbs: Secondary | ICD-10-CM

## 2020-08-11 NOTE — Progress Notes (Signed)
HPI:  [name] is a 50 y.o.male here today for his routine physical examination.  Last CPE: 09/11/16 He lives with his wife and 2 children.  Regular exercise 3 or more times per week: He has not been as consistent due to cold weather. He walks a lot at work, 6000-7000 steps per day. Following a healthy diet: He drinks a lot of water, 60-100 oz. He tries not to eat after 8 pm.  Chronic medical problems: HLD, prediabetes, OSA (mild) not on CPAP, jaw dystonia, and allergies among some. Not witnessed apnea. Hx of STD's: Negative.  Immunization History  Administered Date(s) Administered  . Influenza,inj,Quad PF,6+ Mos 05/27/2013, 09/11/2016, 08/20/2018, 05/13/2019  . PFIZER SARS-COV-2 Vaccination 10/20/2019, 11/11/2019, 06/22/2020  . Tdap 12/16/2014, 05/13/2018   -Hep C screening: Never  Last colon cancer screening: Never. Last prostate ca screening: Father with hx of prostate cancer, states that this was the caused of his dead. Hx of BPH with nocturia, stable. Last PSA normal at 1.2 on 08/20/18. Nocturia x 2.  Negative for high alcohol intake, tobacco use, or Hx of illicit drug use.  -Concerns and/or follow up today:   " A lot of pain" of right shoulder for about 6-8 months. Pain is intermittent. Exacerbated by movement and when sleeps on right side. He plays golf, which also exacerbates shoulder pain. No limitation ROM. No hx of trauma.  Bilateral hand numbness R>L also for about 6-8 months. Stable. Alleviated by shaking hands. Not sure about exacerbating factors. No associated weakness.  He has tried carpal tunnel splints, which helps. He has "some" neck pain, every morning, it gets better after 9 am. Pain is not radiated and there is no significant limitation of ROM.  HLD:He is on non pharmacologic treatment.  Component     Latest Ref Rng & Units 08/20/2018  Cholesterol     0 - 200 mg/dL 204 (H)  Triglycerides     0.0 - 149.0 mg/dL 73.0  HDL  Cholesterol     >39.00 mg/dL 50.40  VLDL     0.0 - 40.0 mg/dL 14.6  LDL (calc)     0 - 99 mg/dL 139 (H)  Total CHOL/HDL Ratio      4  NonHDL      154.09   Review of Systems  Constitutional: Negative for activity change, appetite change, fatigue and fever.  HENT: Negative for mouth sores, nosebleeds and sore throat.   Eyes: Negative for redness and visual disturbance.  Respiratory: Negative for cough, shortness of breath and wheezing.   Cardiovascular: Negative for chest pain, palpitations and leg swelling.  Gastrointestinal: Negative for abdominal pain, blood in stool, nausea and vomiting.  Endocrine: Negative for cold intolerance, heat intolerance, polydipsia, polyphagia and polyuria.  Genitourinary: Negative for decreased urine volume, dysuria, genital sores, hematuria and testicular pain.  Musculoskeletal: Positive for arthralgias.  Skin: Negative for color change and rash.  Allergic/Immunologic: Positive for environmental allergies.  Neurological: Negative for syncope, weakness and headaches.  Hematological: Negative for adenopathy. Does not bruise/bleed easily.  Psychiatric/Behavioral: Negative for confusion.   Current Outpatient Medications on File Prior to Visit  Medication Sig Dispense Refill  . fluticasone (FLONASE) 50 MCG/ACT nasal spray SHAKE LIQUID AND USE 1 SPRAY IN EACH NOSTRIL TWICE DAILY 16 g 3  . gabapentin (NEURONTIN) 100 MG capsule Take 3 capsules (300 mg total) by mouth at bedtime. 90 capsule 0  . tamsulosin (FLOMAX) 0.4 MG CAPS capsule TAKE 1 CAPSULE(0.4 MG) BY MOUTH DAILY 90  capsule 2   No current facility-administered medications on file prior to visit.     Past Medical History:  Diagnosis Date  . Asthma   . Hypertension    pt denies    History reviewed. No pertinent surgical history.  Allergies  Allergen Reactions  . Penicillins Rash    Family History  Problem Relation Age of Onset  . Prostate cancer Father     Social History    Socioeconomic History  . Marital status: Single    Spouse name: Not on file  . Number of children: Not on file  . Years of education: Not on file  . Highest education level: Not on file  Occupational History  . Not on file  Tobacco Use  . Smoking status: Never Smoker  . Smokeless tobacco: Never Used  Vaping Use  . Vaping Use: Never used  Substance and Sexual Activity  . Alcohol use: Yes    Comment: 1 time a week  . Drug use: No  . Sexual activity: Not on file  Other Topics Concern  . Not on file  Social History Narrative  . Not on file   Social Determinants of Health   Financial Resource Strain: Not on file  Food Insecurity: Not on file  Transportation Needs: Not on file  Physical Activity: Not on file  Stress: Not on file  Social Connections: Not on file    Vitals:   08/11/20 1444  BP: 122/70  Pulse: 89  Resp: 18  Temp: 98.7 F (37.1 C)  SpO2: 97%   Body mass index is 31.45 kg/m.  Wt Readings from Last 3 Encounters:  08/11/20 219 lb 3.2 oz (99.4 kg)  09/01/19 222 lb 6.4 oz (100.9 kg)  07/30/19 225 lb (102.1 kg)   Physical Exam Vitals and nursing note reviewed.  Constitutional:      General: He is not in acute distress.    Appearance: He is well-developed.  HENT:     Head: Normocephalic and atraumatic.     Right Ear: Tympanic membrane, ear canal and external ear normal.     Left Ear: Tympanic membrane, ear canal and external ear normal.     Mouth/Throat:     Mouth: Oropharynx is clear and moist and mucous membranes are normal. Mucous membranes are moist.     Pharynx: Oropharynx is clear.  Eyes:     Extraocular Movements: Extraocular movements intact and EOM normal.     Conjunctiva/sclera: Conjunctivae normal.     Pupils: Pupils are equal, round, and reactive to light.  Neck:     Thyroid: No thyromegaly.     Trachea: No tracheal deviation.  Cardiovascular:     Rate and Rhythm: Normal rate and regular rhythm.     Pulses:          Dorsalis  pedis pulses are 2+ on the right side and 2+ on the left side.     Heart sounds: No murmur heard.   Pulmonary:     Effort: Pulmonary effort is normal. No respiratory distress.     Breath sounds: Normal breath sounds.  Chest:  Breasts:     Right: No supraclavicular adenopathy.     Left: No supraclavicular adenopathy.    Abdominal:     Palpations: Abdomen is soft. There is no hepatomegaly or mass.     Tenderness: There is no abdominal tenderness.  Genitourinary:    Comments: No concerns. Musculoskeletal:        General: No edema.  Right shoulder: Tenderness present. No bony tenderness. Normal range of motion.     Left shoulder: Normal range of motion.     Cervical back: No tenderness or bony tenderness. Decreased range of motion (Mild limitation of extension.).     Comments: No major deformities appreciated and no signs of synovitis. Right shoulder: No deformity, edema, or erythema appreciated. Juanetta Gosling' test pos, empty can supraspinatus test pos, lift-Off Subscapularis test pos. Tinel and Phalen negative , bilateral.    Lymphadenopathy:     Cervical: No cervical adenopathy.     Upper Body:     Right upper body: No supraclavicular adenopathy.     Left upper body: No supraclavicular adenopathy.  Skin:    General: Skin is warm.     Findings: No erythema.  Neurological:     General: No focal deficit present.     Mental Status: He is alert and oriented to person, place, and time.     Cranial Nerves: No cranial nerve deficit.     Sensory: No sensory deficit.     Coordination: Coordination normal.     Gait: Gait normal.     Deep Tendon Reflexes: Strength normal.     Reflex Scores:      Bicep reflexes are 2+ on the right side and 2+ on the left side.      Patellar reflexes are 2+ on the right side and 2+ on the left side. Psychiatric:        Mood and Affect: Mood and affect normal. Mood is not anxious or depressed.        Cognition and Memory: Cognition and memory normal.    ASSESSMENT AND PLAN:  Mr.Cordaro was seen today for annual exam and bilateral numbness/pain to shoulders.  Diagnoses and all orders for this visit: Orders Placed This Encounter  Procedures  . DG Shoulder Right  . Basic metabolic panel  . Lipid panel  . Hepatitis C antibody  . PSA  . Hemoglobin A1c  . Ambulatory referral to Gastroenterology  . Ambulatory referral to Physical Therapy  . NCV with EMG(electromyography)   Lab Results  Component Value Date   CREATININE 0.94 08/12/2020   BUN 16 08/12/2020   NA 138 08/12/2020   K 4.1 08/12/2020   CL 103 08/12/2020   CO2 28 08/12/2020   Lab Results  Component Value Date   CHOL 225 (H) 08/12/2020   HDL 63.70 08/12/2020   LDLCALC 151 (H) 08/12/2020   LDLDIRECT 138.9 05/27/2013   TRIG 54.0 08/12/2020   CHOLHDL 4 08/12/2020   Lab Results  Component Value Date   HGBA1C 5.7 08/12/2020   Lab Results  Component Value Date   PSA 2.00 08/12/2020   Routine general medical examination at a health care facility We discussed the importance of regular physical activity and healthy diet for prevention of chronic illness and/or complications. Preventive guidelines reviewed. Vaccination up to date.  Next CPE in a year.  The 10-year ASCVD risk score Denman George DC Montez Hageman., et al., 2013) is: 4%   Values used to calculate the score:     Age: 23 years     Sex: Male     Is Non-Hispanic African American: Yes     Diabetic: No     Tobacco smoker: No     Systolic Blood Pressure: 122 mmHg     Is BP treated: No     HDL Cholesterol: 63.7 mg/dL     Total Cholesterol: 225 mg/dL  Hyperlipidemia, unspecified hyperlipidemia type Continue  non pharmacologic treatment. Further recommendations according to 10 year CVD risk and lipid panel numbers. He is not fasting today, so will have labs done tomorrow.  BPH associated with nocturia Educated about dx and current recommendations in regard to prostate cancer screening.  Chronic right shoulder pain ?  Rotator cuff tendinitis, mild. PT will be arranged. Further recommendations according to imaging results.  Bilateral hand numbness Stable. We discussed possible etiologies. EMG/ studies will be arranged.  Colon cancer screening -     Ambulatory referral to Gastroenterology  Screening for endocrine, metabolic and immunity disorder -     Basic metabolic panel; Future -     Hemoglobin A1c; Future  Encounter for HCV screening test for low risk patient -     Hepatitis C antibody; Future  Carpal tunnel syndrome on both sides Educated about Dx,prognosis,and treatment options. Rx for wrist splints given.   Return in 1 year (on 08/11/2021) for cpe. Fasting labs tomorrow..   Keitha Kolk G. [place], MD  [facility]. [place] office.  A few things to remember from today's visit:  Today you were seen for your physical and we also addressed some concerns.  Routine general medical examination at a health care facility  Hyperlipidemia, unspecified hyperlipidemia type - Plan: Lipid panel  BPH associated with nocturia - Plan: PSA  Chronic right shoulder pain - Plan: Ambulatory referral to Physical Therapy  Bilateral hand numbness - Plan: DG Shoulder Right, NCV with EMG(electromyography)  Colon cancer screening - Plan: Ambulatory referral to Gastroenterology  Screening for endocrine, metabolic and immunity disorder - Plan: Basic metabolic panel, Hemoglobin A1c  Encounter for HCV screening test for low risk patient - Plan: Hepatitis C antibody  Wrist splint on right hand to wear at night.  If you need refills please call your pharmacy. Do not use My Chart to request refills or for acute issues that need immediate attention.    Please be sure medication list is accurate. If a new problem present, please set up appointment sooner than planned today.   At least 150 minutes of moderate exercise per week, daily brisk walking for 15-30 min is a good exercise option. Healthy  diet low in saturated (animal) fats and sweets and consisting of fresh fruits and vegetables, lean meats such as fish and white chicken and whole grains.  - Vaccines:  Tdap vaccine every 10 years.  Shingles vaccine recommended at age 29, could be given after 48 years of age but not sure about insurance coverage.  Pneumonia vaccines: Pneumovax at 665   -Screening recommendations for low/normal risk males:  Screening for diabetes at age 21 and every 3 years. Earlier screening if cardiovascular risk factors.   Lipid screening at 35 and every 3 years. Screening starts in younger males with cardiovascular risk factors.N/A  Colon cancer screening is now at age 64 but your insurance may not cover until age 35 .screening is recommended age 97.  Prostate cancer screening: some controversy, starts usually at 50: Rectal exam and PSA.  Aortic Abdominal Aneurism once between 2 and 87 years old if ever smoker.  Also recommended:  1. Dental visit- Brush and floss your teeth twice daily; visit your dentist twice a year. 2. Eye doctor- Get an eye exam at least every 2 years. 3. Helmet use- Always wear a helmet when riding a bicycle, motorcycle, rollerblading or skateboarding. 4. Safe sex- If you may be exposed to sexually transmitted infections, use a condom. 5. Seat belts- Seat belts can save your  live; always wear one. 6. Smoke/Carbon Monoxide detectors- These detectors need to be installed on the appropriate level of your home. Replace batteries at least once a year. 7. Skin cancer- When out in the sun please cover up and use sunscreen 15 SPF or higher. 8. Violence- If anyone is threatening or hurting you, please tell your healthcare provider.  9. Drink alcohol in moderation- Limit alcohol intake to one drink or less per day. Never drink and drive.

## 2020-08-11 NOTE — Patient Instructions (Addendum)
A few things to remember from today's visit:  Today you were seen for your physical and we also addressed some concerns.  Routine general medical examination at a health care facility  Hyperlipidemia, unspecified hyperlipidemia type - Plan: Lipid panel  BPH associated with nocturia - Plan: PSA  Chronic right shoulder pain - Plan: Ambulatory referral to Physical Therapy  Bilateral hand numbness - Plan: DG Shoulder Right, NCV with EMG(electromyography)  Colon cancer screening - Plan: Ambulatory referral to Gastroenterology  Screening for endocrine, metabolic and immunity disorder - Plan: Basic metabolic panel, Hemoglobin A1c  Encounter for HCV screening test for low risk patient - Plan: Hepatitis C antibody  Wrist splint on right hand to wear at night.  If you need refills please call your pharmacy. Do not use My Chart to request refills or for acute issues that need immediate attention.    Please be sure medication list is accurate. If a new problem present, please set up appointment sooner than planned today.   At least 150 minutes of moderate exercise per week, daily brisk walking for 15-30 min is a good exercise option. Healthy diet low in saturated (animal) fats and sweets and consisting of fresh fruits and vegetables, lean meats such as fish and white chicken and whole grains.  - Vaccines:  Tdap vaccine every 10 years.  Shingles vaccine recommended at age 69, could be given after 48 years of age but not sure about insurance coverage.  Pneumonia vaccines: Pneumovax at 665   -Screening recommendations for low/normal risk males:  Screening for diabetes at age 49 and every 3 years. Earlier screening if cardiovascular risk factors.   Lipid screening at 35 and every 3 years. Screening starts in younger males with cardiovascular risk factors.N/A  Colon cancer screening is now at age 86 but your insurance may not cover until age 41 .screening is recommended age  44.  Prostate cancer screening: some controversy, starts usually at 50: Rectal exam and PSA.  Aortic Abdominal Aneurism once between 56 and 80 years old if ever smoker.  Also recommended:  1. Dental visit- Brush and floss your teeth twice daily; visit your dentist twice a year. 2. Eye doctor- Get an eye exam at least every 2 years. 3. Helmet use- Always wear a helmet when riding a bicycle, motorcycle, rollerblading or skateboarding. 4. Safe sex- If you may be exposed to sexually transmitted infections, use a condom. 5. Seat belts- Seat belts can save your live; always wear one. 6. Smoke/Carbon Monoxide detectors- These detectors need to be installed on the appropriate level of your home. Replace batteries at least once a year. 7. Skin cancer- When out in the sun please cover up and use sunscreen 15 SPF or higher. 8. Violence- If anyone is threatening or hurting you, please tell your healthcare provider.  9. Drink alcohol in moderation- Limit alcohol intake to one drink or less per day. Never drink and drive.

## 2020-08-12 ENCOUNTER — Other Ambulatory Visit (INDEPENDENT_AMBULATORY_CARE_PROVIDER_SITE_OTHER): Payer: BC Managed Care – PPO

## 2020-08-12 DIAGNOSIS — E785 Hyperlipidemia, unspecified: Secondary | ICD-10-CM | POA: Diagnosis not present

## 2020-08-12 DIAGNOSIS — R351 Nocturia: Secondary | ICD-10-CM

## 2020-08-12 DIAGNOSIS — Z1159 Encounter for screening for other viral diseases: Secondary | ICD-10-CM | POA: Diagnosis not present

## 2020-08-12 DIAGNOSIS — Z130 Encounter for screening for diseases of the blood and blood-forming organs and certain disorders involving the immune mechanism: Secondary | ICD-10-CM

## 2020-08-12 DIAGNOSIS — Z1329 Encounter for screening for other suspected endocrine disorder: Secondary | ICD-10-CM | POA: Diagnosis not present

## 2020-08-12 DIAGNOSIS — Z13228 Encounter for screening for other metabolic disorders: Secondary | ICD-10-CM

## 2020-08-12 DIAGNOSIS — N401 Enlarged prostate with lower urinary tract symptoms: Secondary | ICD-10-CM

## 2020-08-12 LAB — LIPID PANEL
Cholesterol: 225 mg/dL — ABNORMAL HIGH (ref 0–200)
HDL: 63.7 mg/dL (ref >39.00)
LDL Cholesterol: 151 mg/dL — ABNORMAL HIGH (ref 0–99)
NonHDL: 161.33
Total CHOL/HDL Ratio: 4
Triglycerides: 54 mg/dL (ref 0.0–149.0)
VLDL: 10.8 mg/dL (ref 0.0–40.0)

## 2020-08-12 LAB — BASIC METABOLIC PANEL
BUN: 16 mg/dL (ref 6–23)
CO2: 28 mEq/L (ref 19–32)
Calcium: 9.3 mg/dL (ref 8.4–10.5)
Chloride: 103 mEq/L (ref 96–112)
Creatinine, Ser: 0.94 mg/dL (ref 0.40–1.50)
GFR: 96.71 mL/min (ref >60.00)
Glucose, Bld: 100 mg/dL — ABNORMAL HIGH (ref 70–99)
Potassium: 4.1 mEq/L (ref 3.5–5.1)
Sodium: 138 mEq/L (ref 135–145)

## 2020-08-12 LAB — HEMOGLOBIN A1C: Hgb A1c MFr Bld: 5.7 % (ref 4.6–6.5)

## 2020-08-12 LAB — PSA: PSA: 2 ng/mL (ref 0.10–4.00)

## 2020-08-13 LAB — HEPATITIS C ANTIBODY
Hepatitis C Ab: NONREACTIVE
SIGNAL TO CUT-OFF: 0.02 (ref <1.00)

## 2020-08-17 ENCOUNTER — Encounter: Payer: Self-pay | Admitting: Internal Medicine

## 2020-08-25 DIAGNOSIS — G249 Dystonia, unspecified: Secondary | ICD-10-CM | POA: Diagnosis not present

## 2020-08-26 ENCOUNTER — Other Ambulatory Visit: Payer: Self-pay

## 2020-08-26 ENCOUNTER — Ambulatory Visit (AMBULATORY_SURGERY_CENTER): Payer: Self-pay | Admitting: *Deleted

## 2020-08-26 VITALS — Ht 70.0 in | Wt 219.0 lb

## 2020-08-26 DIAGNOSIS — Z1211 Encounter for screening for malignant neoplasm of colon: Secondary | ICD-10-CM

## 2020-08-26 MED ORDER — PLENVU 140 G PO SOLR: 1.0000 | ORAL | 0 refills | Status: DC

## 2020-08-26 NOTE — Progress Notes (Signed)
No egg or soy allergy known to patient  No issues with past sedation with any surgeries or procedures No intubation problems in the past  No FH of Malignant Hyperthermia No diet pills per patient No home 02 use per patient  No blood thinners per patient  Pt denies issues with constipation  No A fib or A flutter  EMMI video to pt or via MyChart  COVID 19 guidelines implemented in [facility] today with Pt and RN  Pt is fully vaccinated  for Covid   Plenvu  Coupon given to pt in PV today , Code to Pharmacy   Due to the COVID-19 pandemic we are asking patients to follow certain guidelines.  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

## 2020-09-10 ENCOUNTER — Encounter: Payer: Self-pay | Admitting: Internal Medicine

## 2020-09-10 ENCOUNTER — Other Ambulatory Visit: Payer: Self-pay

## 2020-09-10 ENCOUNTER — Ambulatory Visit (AMBULATORY_SURGERY_CENTER): Payer: BC Managed Care – PPO | Admitting: Internal Medicine

## 2020-09-10 VITALS — BP 115/66 | HR 50 | Temp 96.9°F | Resp 13 | Ht 70.0 in | Wt 219.0 lb

## 2020-09-10 DIAGNOSIS — Z1211 Encounter for screening for malignant neoplasm of colon: Secondary | ICD-10-CM

## 2020-09-10 MED ORDER — SODIUM CHLORIDE 0.9 % IV SOLN: 500.0000 mL | Freq: Once | INTRAVENOUS | Status: DC

## 2020-09-10 NOTE — Progress Notes (Signed)
Medical history reviewed with no changes noted. VS assessed by C.W 

## 2020-09-10 NOTE — Progress Notes (Signed)
A and O x3. Report to RN. Tolerated MAC anesthesia well.

## 2020-09-10 NOTE — Patient Instructions (Signed)
You have a normal colon exam.  You don't need another colonoscopy for 10 years!  YOU HAD AN ENDOSCOPIC PROCEDURE TODAY AT THE [facility]:   Refer to the procedure report that was given to you for any specific questions about what was found during the examination.  If the procedure report does not answer your questions, please call your gastroenterologist to clarify.  If you requested that your care partner not be given the details of your procedure findings, then the procedure report has been included in a sealed envelope for you to review at your convenience later.  YOU SHOULD EXPECT: Some feelings of bloating in the abdomen. Passage of more gas than usual.  Walking can help get rid of the air that was put into your GI tract during the procedure and reduce the bloating. If you had a lower endoscopy (such as a colonoscopy or flexible sigmoidoscopy) you may notice spotting of blood in your stool or on the toilet paper. If you underwent a bowel prep for your procedure, you may not have a normal bowel movement for a few days.  Please Note:  You might notice some irritation and congestion in your nose or some drainage.  This is from the oxygen used during your procedure.  There is no need for concern and it should clear up in a day or so.  SYMPTOMS TO REPORT IMMEDIATELY:   Following lower endoscopy (colonoscopy or flexible sigmoidoscopy):  Excessive amounts of blood in the stool  Significant tenderness or worsening of abdominal pains  Swelling of the abdomen that is new, acute  Fever of 100F or higher  For urgent or emergent issues, a gastroenterologist can be reached at any hour by calling [phone]. Do not use MyChart messaging for urgent concerns.    DIET:  We do recommend a small meal at first, but then you may proceed to your regular diet.  Drink plenty of fluids but you should avoid alcoholic beverages for 24 hours.  ACTIVITY:  You should plan to take it easy for the rest  of today and you should NOT DRIVE or use heavy machinery until tomorrow (because of the sedation medicines used during the test).    FOLLOW UP: Our staff will call the number listed on your records 48-72 hours following your procedure to check on you and address any questions or concerns that you may have regarding the information given to you following your procedure. If we do not reach you, we will leave a message.  We will attempt to reach you two times.  During this call, we will ask if you have developed any symptoms of COVID 19. If you develop any symptoms (ie: fever, flu-like symptoms, shortness of breath, cough etc.) before then, please call [phone].  If you test positive for Covid 19 in the 2 weeks post procedure, please call and report this information to [place].    If any biopsies were taken you will be contacted by phone or by letter within the next 1-3 weeks.  Please call us at [phone] if you have not heard about the biopsies in 3 weeks.    SIGNATURES/CONFIDENTIALITY: You and/or your care partner have signed paperwork which will be entered into your electronic medical record.  These signatures attest to the fact that that the information above on your After Visit Summary has been reviewed and is understood.  Full responsibility of the confidentiality of this discharge information lies with you and/or your care-partner.

## 2020-09-10 NOTE — Op Note (Signed)
[facility] Patient Name: Sean Romero Procedure Date: 09/10/2020 2:21 PM MRN: [id number] Endoscopist: Beverley Fiedler , MD Age: 50 Referring MD:  Date of Birth: 12-Apr-1971 Gender: Male Account #: [id number] Procedure:                Colonoscopy Indications:              Screening for colorectal malignant neoplasm, This                            is the patient's first colonoscopy Medicines:                Monitored Anesthesia Care Procedure:                Pre-Anesthesia Assessment:                           - Prior to the procedure, a History and Physical                            was performed, and patient medications and                            allergies were reviewed. The patient's tolerance of                            previous anesthesia was also reviewed. The risks                            and benefits of the procedure and the sedation                            options and risks were discussed with the patient.                            All questions were answered, and informed consent                            was obtained. Prior Anticoagulants: The patient has                            taken no previous anticoagulant or antiplatelet                            agents. ASA Grade Assessment: II - A patient with                            mild systemic disease. After reviewing the risks                            and benefits, the patient was deemed in                            satisfactory condition to undergo the procedure.  After obtaining informed consent, the colonoscope                            was passed under direct vision. Throughout the                            procedure, the patient's blood pressure, pulse, and                            oxygen saturations were monitored continuously. The                            Olympus CF-HQ190 [id number]) Colonoscope was                            introduced through the anus and  advanced to the                            cecum, identified by appendiceal orifice and                            ileocecal valve. The colonoscopy was performed                            without difficulty. The patient tolerated the                            procedure well. The quality of the bowel                            preparation was excellent. The ileocecal valve,                            appendiceal orifice, and rectum were photographed. Scope In: 2:39:11 PM Scope Out: 2:47:20 PM Scope Withdrawal Time: 0 hours 6 minutes 11 seconds  Total Procedure Duration: 0 hours 8 minutes 9 seconds  Findings:                 The digital rectal exam was normal.                           The entire examined colon appeared normal on direct                            and retroflexion views. Complications:            No immediate complications. Estimated Blood Loss:     Estimated blood loss: none. Impression:               - The entire examined colon is normal on direct and                            retroflexion views.                           - No specimens collected. Recommendation:           -  Patient has a contact number available for                            emergencies. The signs and symptoms of potential                            delayed complications were discussed with the                            patient. Return to normal activities tomorrow.                            Written discharge instructions were provided to the                            patient.                           - Resume previous diet.                           - Continue present medications.                           - Repeat colonoscopy in 10 years for screening                            purposes. Beverley Fiedler, MD 09/10/2020 2:48:47 PM This report has been signed electronically.

## 2020-09-14 ENCOUNTER — Telehealth: Payer: Self-pay | Admitting: *Deleted

## 2020-09-14 NOTE — Telephone Encounter (Signed)
Attempted f/u phone call. No answer. Left message. °

## 2020-09-14 NOTE — Telephone Encounter (Signed)
  Follow up Call-  Call back number 09/10/2020  Post procedure Call Back phone  # [phone]  Permission to leave phone message Yes  Some recent data might be hidden     Patient questions:  Do you have a fever, pain , or abdominal swelling? No. Pain Score  0 *  Have you tolerated food without any problems? Yes.    Have you been able to return to your normal activities? Yes.    Do you have any questions about your discharge instructions: Diet   No. Medications  No. Follow up visit  No.  Do you have questions or concerns about your Care? No.  Actions: * If pain score is 4 or above: No action needed, pain <4.  1. Have you developed a fever since your procedure? no  2.   Have you had an respiratory symptoms (SOB or cough) since your procedure? no  3.   Have you tested positive for COVID 19 since your procedure no  4.   Have you had any family members/close contacts diagnosed with the COVID 19 since your procedure?  no   If yes to any of these questions please route to Laverna Peace, RN and Karlton Lemon, RN

## 2020-09-21 ENCOUNTER — Other Ambulatory Visit: Payer: Self-pay

## 2020-09-21 ENCOUNTER — Encounter: Payer: Self-pay | Admitting: Neurology

## 2020-09-21 DIAGNOSIS — R200 Anesthesia of skin: Secondary | ICD-10-CM

## 2020-10-06 ENCOUNTER — Other Ambulatory Visit: Payer: Self-pay

## 2020-10-06 ENCOUNTER — Encounter: Payer: Self-pay | Admitting: Physical Therapy

## 2020-10-06 ENCOUNTER — Ambulatory Visit: Payer: BC Managed Care – PPO | Attending: Family Medicine | Admitting: Physical Therapy

## 2020-10-06 DIAGNOSIS — R293 Abnormal posture: Secondary | ICD-10-CM | POA: Insufficient documentation

## 2020-10-06 DIAGNOSIS — G8929 Other chronic pain: Secondary | ICD-10-CM | POA: Diagnosis not present

## 2020-10-06 DIAGNOSIS — M25511 Pain in right shoulder: Secondary | ICD-10-CM | POA: Insufficient documentation

## 2020-10-06 NOTE — Patient Instructions (Signed)
Trigger Point Dry Needling  . What is Trigger Point Dry Needling (DN)? o DN is a physical therapy technique used to treat muscle pain and dysfunction. Specifically, DN helps deactivate muscle trigger points (muscle knots).  o A thin filiform needle is used to penetrate the skin and stimulate the underlying trigger point. The goal is for a local twitch response (LTR) to occur and for the trigger point to relax. No medication of any kind is injected during the procedure.   . What Does Trigger Point Dry Needling Feel Like?  o The procedure feels different for each individual patient. Some patients report that they do not actually feel the needle enter the skin and overall the process is not painful. Very mild bleeding may occur. However, many patients feel a deep cramping in the muscle in which the needle was inserted. This is the local twitch response.   Marland Kitchen How Will I feel after the treatment? o Soreness is normal, and the onset of soreness may not occur for a few hours. Typically this soreness does not last longer than two days.  o Bruising is uncommon, however; ice can be used to decrease any possible bruising.  o In rare cases feeling tired or nauseous after the treatment is normal. In addition, your symptoms may get worse before they get better, this period will typically not last longer than 24 hours.   . What Can I do After My Treatment? o Increase your hydration by drinking more water for the next 24 hours. o You may place ice or heat on the areas treated that have become sore, however, do not use heat on inflamed or bruised areas. Heat often brings more relief post needling. o You can continue your regular activities, but vigorous activity is not recommended initially after the treatment for 24 hours. o DN is best combined with other physical therapy such as strengthening, stretching, and other therapies.    [facility] [address], [address] [place], [place]  [address] Phone # [phone] Fax [phone]   Can use ice as an ice cup massage 2-3 min over painful tendons or ice pack 10-15 min to Rt shoulder, especially after golf.

## 2020-10-06 NOTE — Therapy (Signed)
[facility] [address], [address] [place], [place], [address] Phone: [phone]   Fax:  [phone]  Physical Therapy Evaluation  Patient Details  Name: Sean Romero MRN: [id number] Date of Birth: 04/18/1971 Referring Provider (PT): [place], Betty G, MD   Encounter Date: 10/06/2020   PT End of Session - 10/06/20 1349    Visit Number 1    Date for PT Re-Evaluation 12/01/20    Authorization Type BCBS    PT Start Time 1200   Pt 15 min late   PT Stop Time 1230    PT Time Calculation (min) 30 min    Activity Tolerance Patient tolerated treatment well    Behavior [facility] for tasks assessed/performed           Past Medical History:  Diagnosis Date  . Allergy   . Arthritis    mild right shoulder   . Asthma   . 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 Hypertension    pt denies  . Sleep apnea    treated x 90 days now off Cpap     Past Surgical History:  Procedure Laterality Date  . ACHILLES TENDON REPAIR Left    ~ 20 yrs ago   . cyst removed  left knee      There were no vitals filed for this visit.    Subjective Assessment - 10/06/20 1200    Subjective Pt referred for Rt shoulder pain which started 6 mos ago without injury.  He plays golf and can hardly pick arm up after playing.    Pertinent History played baseball as a kid but no injuries    Limitations Lifting;House hold activities    Diagnostic tests xray Rt shoulder: mild AC joint arthritis    Patient Stated Goals move arm without pain, play with my young kids, throw a ball/play catch with son, reduced pain after playing golf    Currently in Pain? Yes    Pain Score 7     Pain Location Shoulder    Pain Orientation Right;Anterior;Posterior    Pain Descriptors / Indicators Stabbing;Sharp    Pain Type Chronic pain    Pain Onset More than a month ago    Pain Frequency  Intermittent    Aggravating Factors  playing golf    Pain Relieving Factors ibuprofen intermittently              [facility] PT Assessment - 10/06/20 0001      Assessment   Medical Diagnosis M25.511,G89.29 (ICD-10-CM) - Chronic right shoulder pain    Referring Provider (PT) [place], Betty G, MD    Onset Date/Surgical Date --   6 mos   Hand Dominance Right    Next MD Visit as needed    Prior Therapy no      Precautions   Precautions None      Prior Function   Level of Independence Independent    Vocation Full time employment    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[employer], 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    Leisure play with 3 and 48 yo golf, fish      Observation/Other Assessments   Focus on Therapeutic Outcomes (FOTO)  next time, Pt 15 min late      ROM / Strength   AROM / PROM / Strength AROM;PROM;Strength  AROM   Overall AROM Comments LT shoulder full and painfree, Rt shoulder full with end range stiffness and painful arc with flexion and abduction      PROM   Overall PROM Comments Lt shoulder full and painfree, Rt shoulder pain with end range overpressure flexion and abduction      Strength   Overall Strength Comments bil shoulders 5/5 with pain on Rt abd and ER testing, 5/5 elbow flexion no pain, scapular strength 5/5 bil      Palpation   Spinal mobility limited thoracic PAs throughout    Palpation comment Rt upper trap spasm with signif bulk, tender: Rt infraspinatus TP present, Rt RC tendons and bicep tendon on Rt, Rt AC joint      Special Tests    Special Tests Rotator Cuff Impingement    Rotator Cuff Impingment tests Neer impingement test;Hawkins- Kennedy test      Neer Impingement test    Findings Positive    Side Right      Hawkins-Kennedy test   Findings Positive    Side Right                      Objective measurements completed on examination: See above findings.       [facility] Adult PT Treatment/Exercise - 10/06/20 0001       Self-Care   Self-Care Other Self-Care Comments    Other Self-Care Comments  DN info, ice cup massage or ice pack to Rt shoulder as needed                  PT Education - 10/06/20 1227    Education Details DN info and ice cup massage or ice pack after golf or for pain as needed, Pt 15 min late so no HEP issued at first visit    Person(s) Educated Patient    Methods Explanation;Handout    Comprehension Verbalized understanding            PT Short Term Goals - 10/06/20 1359      PT SHORT TERM GOAL #1   Title Pt will be ind with initial HEP    Time 4    Period Weeks    Status New    Target Date 11/03/20      PT SHORT TERM GOAL #2   Title Pt will be able to perform resisted isom testing of Rt shoulder abd and ER without pain    Time 4    Period Weeks    Status New    Target Date 11/03/20      PT SHORT TERM GOAL #3   Title Pt will be able to perform A/ROM of Rt shoulder into flexion and scaption without painful arc.    Time 4    Period Weeks    Status New    Target Date 11/03/20      PT SHORT TERM GOAL #4   Title Pt will report at least 30% reduction in Rt shoulder pain with Rt UE use.    Time 6    Period Weeks    Status New    Target Date 11/17/20             PT Long Term Goals - 10/06/20 1401      PT LONG TERM GOAL #1   Title Pt will be ind with advanced HEP    Time 8    Period Weeks    Status New    Target Date  12/01/20      PT LONG TERM GOAL #2   Title Pt will be able to play a round of golf with min to no pain afterwards.    Time 8    Period Weeks    Status New    Target Date 12/01/20      PT LONG TERM GOAL #3   Title Pt will be able to throw a ball to play catch with his kids with no to min pain.    Baseline -    Time 8    Period Weeks    Status New    Target Date 12/01/20      PT LONG TERM GOAL #4   Title Pt will report at least 70% reduction in pain with Rt UE use both at work and home activities.    Time 8    Period Weeks     Status New    Target Date 12/01/20                  Plan - 10/06/20 1349    Clinical Impression Statement Pt is a Rt hand dominant 49yo who presents to PT with 45mo history of Rt shoulder pain without known injury.  He plays golf and notes increased pain within an hour of playing, to the point where he can hardly lift the arm.  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  He presents end range stiffness into flexion and abd on Rt, with otherwise full Rt shoulder ROM but with painful arc present.  He has pain with functional ER.  Strength is 5/5 bil shoulders and elbow flexion, but with pain on Rt shoulder abd and ER testing.  He is tender along RC tendons on right > bicep tendon.  + RC impingement tests on Rt.  Pt has trigger points and spasm in Rt upper trap, infraspinatus, and upper thoracic multifidi.  AC joint on Rt is tender and stiff.  Thoracic PAs are limited throughout.  Pt will benefit from skilled PT and modalities to address pain, ROM and functional deficits related to Rt shoulder pain.    Personal Factors and Comorbidities Time since onset of injury/illness/exacerbation    Examination-Activity Limitations Reach Overhead;Lift;Dressing    Examination-Participation Restrictions Occupation;Community Activity    Stability/Clinical Decision Making Stable/Uncomplicated    Clinical Decision Making Low    Rehab Potential Excellent    PT Frequency 1x / week    PT Duration 8 weeks    PT Treatment/Interventions ADLs/Self Care Home Management;Spinal Manipulations;Joint Manipulations;Passive range of motion;Dry needling;Taping;Iontophoresis 4mg /ml Dexamethasone;Electrical Stimulation;Cryotherapy;Moist Heat;Traction;Therapeutic exercise;Functional mobility training;Patient/family education;Manual techniques    PT Next Visit Plan do FOTO and set goal (Pt was late), DN Rt upper  trap, lower cervical and upper t-spine multif, Rt infraspin, ionto patch #1 if cert signed, thoracic mobs, begin HEP for AA/ROM and thoracic rot    PT Home Exercise Plan DN info    Consulted and Agree with Plan of Care Patient           Patient will benefit from skilled therapeutic intervention in order to improve the following deficits and impairments:  Decreased range of motion,Hypomobility,Impaired flexibility,Pain,Increased muscle spasms,Impaired UE functional use,Postural dysfunction,Improper body mechanics,Decreased activity tolerance  Visit Diagnosis: Chronic right shoulder pain - Plan: PT plan of care cert/re-cert  Abnormal posture - Plan: PT plan  of care cert/re-cert     Problem List Patient Active Problem List   Diagnosis Date Noted  . Prediabetes 08/20/2018  . Class 1 obesity with body mass index (BMI) of 33.0 to 33.9 in adult 08/20/2018  . Chest wall pain 06/10/2018  . Tendinitis of both elbows 08/13/2017  . Allergic rhinitis due to pollen 08/13/2017  . Snoring 08/13/2017  . Hyperlipidemia 09/11/2016  . BPH associated with nocturia 09/11/2016  . Family history of prostate cancer 10/06/2014  . Routine general medical examination at a health care facility 05/27/2013    Sean Romero 10/06/2020, 2:08 PM  [facility] [address], [address] [place], [place], [address] Phone: [phone]   Fax:  [phone]  Name: Sean Romero MRN: [id number] Date of Birth: [birth year]

## 2020-10-11 ENCOUNTER — Ambulatory Visit: Payer: BC Managed Care – PPO

## 2020-10-11 ENCOUNTER — Other Ambulatory Visit: Payer: Self-pay

## 2020-10-11 DIAGNOSIS — G8929 Other chronic pain: Secondary | ICD-10-CM

## 2020-10-11 DIAGNOSIS — M25511 Pain in right shoulder: Secondary | ICD-10-CM | POA: Diagnosis not present

## 2020-10-11 DIAGNOSIS — R293 Abnormal posture: Secondary | ICD-10-CM | POA: Diagnosis not present

## 2020-10-11 NOTE — Therapy (Signed)
[facility] [address] [place], [place], [address] Phone: [phone]   Fax:  [phone]  Physical Therapy Treatment  Patient Details  Name: Sean Romero MRN: [id number] Date of Birth: 1970-10-19 Referring Provider (PT): [place], Betty G, MD   Encounter Date: 10/11/2020   PT End of Session - 10/11/20 1224    Visit Number 2    Date for PT Re-Evaluation 12/01/20    Authorization Type BCBS    PT Start Time 1147    PT Stop Time 1223    PT Time Calculation (min) 36 min    Activity Tolerance Patient tolerated treatment well    Behavior During Therapy [facility] for tasks assessed/performed           Past Medical History:  Diagnosis Date  . Allergy   . Arthritis    mild right shoulder   . Asthma   . 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 Hypertension    pt denies  . Sleep apnea    treated x 90 days now off Cpap     Past Surgical History:  Procedure Laterality Date  . ACHILLES TENDON REPAIR Left    ~ 20 yrs ago   . cyst removed  left knee      There were no vitals filed for this visit.   Subjective Assessment - 10/11/20 1148    Subjective I'm doing ok.  Pain isn't too bad.  Played golf and had some pain after this.    Currently in Pain? Yes    Pain Score 4     Pain Location Shoulder    Pain Orientation Right;Anterior;Posterior    Pain Descriptors / Indicators Stabbing;Sharp    Pain Type Chronic pain    Pain Onset More than a month ago    Pain Frequency Intermittent    Aggravating Factors  playing golf    Pain Relieving Factors ibuprofen somtimes. ice pack.              [facility] PT Assessment - 10/11/20 0001      Observation/Other Assessments   Focus on Therapeutic Outcomes (FOTO)  63 (goal is 76)                         [facility] Adult PT Treatment/Exercise - 10/11/20 0001      Exercises   Exercises  Shoulder;Neck      Shoulder Exercises: Standing   Flexion Strengthening;Right;10 reps    Flexion Limitations using wall- slides with gentle overpressure    ABduction AAROM;Right;10 reps    ABduction Limitations using wall- sliding with gentle overpressure    Extension Strengthening;Both;20 reps;Theraband    Theraband Level (Shoulder Extension) Level 2 (Red)    Row Strengthening;Both;20 reps;Theraband    Theraband Level (Shoulder Row) Level 2 (Red)      Shoulder Exercises: Stretch   Corner Stretch 3 reps;10 seconds    Other Shoulder Stretches posterior capsule stretch 3x20      Modalities   Modalities Iontophoresis      Iontophoresis   Type of Iontophoresis Dexamethasone    Location Rt shoulder    Dose 1.0 cc   #1   Time 6 hour wear time      Neck Exercises: Stretches   Upper Trapezius Stretch Right;3 reps;20 seconds  PT Education - 10/11/20 1218    Education Details Access Code: [email or web address], ionto info    Person(s) Educated Patient    Methods Explanation;Demonstration;Handout    Comprehension Verbalized understanding;Returned demonstration            PT Short Term Goals - 10/06/20 1359      PT SHORT TERM GOAL #1   Title Pt will be ind with initial HEP    Time 4    Period Weeks    Status New    Target Date 11/03/20      PT SHORT TERM GOAL #2   Title Pt will be able to perform resisted isom testing of Rt shoulder abd and ER without pain    Time 4    Period Weeks    Status New    Target Date 11/03/20      PT SHORT TERM GOAL #3   Title Pt will be able to perform A/ROM of Rt shoulder into flexion and scaption without painful arc.    Time 4    Period Weeks    Status New    Target Date 11/03/20      PT SHORT TERM GOAL #4   Title Pt will report at least 30% reduction in Rt shoulder pain with Rt UE use.    Time 6    Period Weeks    Status New    Target Date 11/17/20             PT Long Term Goals - 10/06/20 1401      PT LONG  TERM GOAL #1   Title Pt will be ind with advanced HEP    Time 8    Period Weeks    Status New    Target Date 12/01/20      PT LONG TERM GOAL #2   Title Pt will be able to play a round of golf with min to no pain afterwards.    Time 8    Period Weeks    Status New    Target Date 12/01/20      PT LONG TERM GOAL #3   Title Pt will be able to throw a ball to play catch with his kids with no to min pain.    Baseline -    Time 8    Period Weeks    Status New    Target Date 12/01/20      PT LONG TERM GOAL #4   Title Pt will report at least 70% reduction in pain with Rt UE use both at work and home activities.    Time 8    Period Weeks    Status New    Target Date 12/01/20                 Plan - 10/11/20 1215    Clinical Impression Statement Pt with first time follow-up after evaluation.  FOTO was complete and score is 63 with goal of 76.  PT established HEP today as this was not completed on first session due to time constraints.  Pt required postural cueing throughout session for scapular retraction and depression.  Pt with limited thoracic mobility that improved with increased reps of open book stretch.  Pt experienced pain in certain range of Rt shoulder ~80-120 degrees with both concentric and eccentric motion.  Pt was able to play golf this weekend with some increased pan after.  Pt will benefit from continued PT to address posture, Rt shoulder mobility and  strength.    PT Frequency 1x / week    PT Duration 8 weeks    PT Treatment/Interventions ADLs/Self Care Home Management;Spinal Manipulations;Joint Manipulations;Passive range of motion;Dry needling;Taping;Iontophoresis 4mg /ml Dexamethasone;Electrical Stimulation;Cryotherapy;Moist Heat;Traction;Therapeutic exercise;Functional mobility training;Patient/family education;Manual techniques    PT Next Visit Plan review new HEP, ionto #2, DN to upper trap and thoracic paraspinals    Consulted and Agree with Plan of Care Patient            Patient will benefit from skilled therapeutic intervention in order to improve the following deficits and impairments:  Decreased range of motion,Hypomobility,Impaired flexibility,Pain,Increased muscle spasms,Impaired UE functional use,Postural dysfunction,Improper body mechanics,Decreased activity tolerance  Visit Diagnosis: Chronic right shoulder pain  Abnormal posture     Problem List Patient Active Problem List   Diagnosis Date Noted  . Prediabetes 08/20/2018  . Class 1 obesity with body mass index (BMI) of 33.0 to 33.9 in adult 08/20/2018  . Chest wall pain 06/10/2018  . Tendinitis of both elbows 08/13/2017  . Allergic rhinitis due to pollen 08/13/2017  . Snoring 08/13/2017  . Hyperlipidemia 09/11/2016  . BPH associated with nocturia 09/11/2016  . Family history of prostate cancer 10/06/2014  . Routine general medical examination at a health care facility 05/27/2013     05/29/2013, PT 10/11/20 12:25 PM  [facility] [address] [place], [place], [place] Phone: [phone]   Fax:  [phone]  Name: IZAIAS KRUPKA MRN: Dorina Hoyer Date of Birth: September 02, 1970

## 2020-10-11 NOTE — Patient Instructions (Addendum)
Access Code: [email or web address] URL: [email or web address] Date: 10/11/2020 Prepared by: Tresa Endo  Exercises Sidelying Thoracic Rotation with Open Book - 2 x daily - 7 x weekly - 1 sets - 10 reps Seated Upper Trapezius Stretch - 3 x daily - 7 x weekly - 1 sets - 3 reps - 20 hold Standing Shoulder Posterior Capsule Stretch - 2 x daily - 7 x weekly - 1 sets - 3 reps - 20 hold Shoulder Flexion Wall Slide with Towel - 2 x daily - 7 x weekly - 1 sets - 10 reps - 10 hold Standing Row with Anchored Resistance - 1 x daily - 7 x weekly - 2 sets - 10 reps Shoulder Extension with Resistance - 1 x daily - 7 x weekly - 2 sets - 10 reps Doorway Pec Stretch at 90 Degrees Abduction - 1 x daily - 7 x weekly - 3 sets - 10 reps   IONTOPHORESIS PATIENT PRECAUTIONS & CONTRAINDICATIONS:  . Redness under one or both electrodes can occur.  This characterized by a uniform redness that usually disappears within 12 hours of treatment. . Small pinhead size blisters may result in response to the drug.  Contact your physician if the problem persists more than 24 hours. . On rare occasions, iontophoresis therapy can result in temporary skin reactions such as rash, inflammation, irritation or burns.  The skin reactions may be the result of individual sensitivity to the ionic solution used, the condition of the skin at the start of treatment, reaction to the materials in the electrodes, allergies or sensitivity to dexamethasone, or a poor connection between the patch and your skin.  Discontinue using iontophoresis if you have any of these reactions and report to your therapist. . Remove the Patch or electrodes if you have any undue sensation of pain or burning during the treatment and report discomfort to your therapist. . Tell your Therapist if you have had known adverse reactions to the application of electrical current. . If using the Patch, the LED light will turn off when treatment is complete and the patch can be  removed.  Approximate treatment time is 1-3 hours.  Remove the patch when light goes off or after 6 hours. . The Patch can be worn during normal activity, however excessive motion where the electrodes have been placed can cause poor contact between the skin and the electrode or uneven electrical current resulting in greater risk of skin irritation. Marland Kitchen Keep out of the reach of children.   . DO NOT use if you have a cardiac pacemaker or any other electrically sensitive implanted device. . DO NOT use if you have a known sensitivity to dexamethasone. . DO NOT use during Magnetic Resonance Imaging (MRI). . DO NOT use over broken or compromised skin (e.g. sunburn, cuts, or acne) due to the increased risk of skin reaction. . DO NOT SHAVE over the area to be treated:  To establish good contact between the Patch and the skin, excessive hair may be clipped. . DO NOT place the Patch or electrodes on or over your eyes, directly over your heart, or brain. . DO NOT reuse the Patch or electrodes as this may cause burns to occur. [facility] [address] [place], [place] [address] Phone # [phone] Fax [phone]

## 2020-10-19 ENCOUNTER — Ambulatory Visit: Payer: BC Managed Care – PPO

## 2020-10-19 ENCOUNTER — Other Ambulatory Visit: Payer: Self-pay

## 2020-10-19 DIAGNOSIS — G8929 Other chronic pain: Secondary | ICD-10-CM

## 2020-10-19 DIAGNOSIS — R293 Abnormal posture: Secondary | ICD-10-CM

## 2020-10-19 DIAGNOSIS — M25511 Pain in right shoulder: Secondary | ICD-10-CM | POA: Diagnosis not present

## 2020-10-19 NOTE — Therapy (Signed)
[facility] [address] [place], [place], [address] Phone: [phone]   Fax:  [phone]  Physical Therapy Treatment  Patient Details  Name: Sean Romero MRN: [id number] Date of Birth: March 28, 1971 Referring Provider (PT): [place], Betty G, MD   Encounter Date: 10/19/2020   PT End of Session - 10/19/20 0800    Visit Number 3    Date for PT Re-Evaluation 12/01/20    Authorization Type BCBS    PT Start Time 0730    PT Stop Time 0800    PT Time Calculation (min) 30 min    Activity Tolerance Patient tolerated treatment well    Behavior During Therapy [facility] for tasks assessed/performed           Past Medical History:  Diagnosis Date  . Allergy   . Arthritis    mild right shoulder   . Asthma   . 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 Hypertension    pt denies  . Sleep apnea    treated x 90 days now off Cpap     Past Surgical History:  Procedure Laterality Date  . ACHILLES TENDON REPAIR Left    ~ 20 yrs ago   . cyst removed  left knee      There were no vitals filed for this visit.   Subjective Assessment - 10/19/20 0733    Subjective I am doing well today.  I have had up to 7/10 pain Rt shoulder pain with reaching overhead and coming back down.    Patient Stated Goals move arm without pain, play with my young kids, throw a ball/play catch with son, reduced pain after playing golf    Pain Score 0-No pain    Aggravating Factors  reaching overhead and coming back down    Pain Relieving Factors rest, ibuprofen, ice                             [facility] Adult PT Treatment/Exercise - 10/19/20 0001      Shoulder Exercises: Standing   Flexion Strengthening;Right;10 reps    Flexion Limitations using finger ladder- slides with gentle overpressure    ABduction AAROM;Right;10 reps    Extension  Strengthening;Both;20 reps;Theraband    Theraband Level (Shoulder Extension) Level 2 (Red)    Row Strengthening;Both;20 reps;Theraband    Theraband Level (Shoulder Row) Level 2 (Red)    Row Limitations tactile cues for scapular depression      Shoulder Exercises: Pulleys   Scaption 2 minutes      Shoulder Exercises: Stretch   Corner Stretch 3 reps;10 seconds      Modalities   Modalities Iontophoresis      Iontophoresis   Type of Iontophoresis Dexamethasone    Location Rt shoulder    Dose 1.0 cc   #2   Time 6 hour wear time      Neck Exercises: Stretches   Upper Trapezius Stretch Right;3 reps;20 seconds                    PT Short Term Goals - 10/19/20 0734      PT SHORT TERM GOAL #1   Title Pt will be ind with initial HEP    Status Achieved      PT SHORT TERM GOAL #2  Title Pt will be able to perform resisted isom testing of Rt shoulder abd and ER without pain    Baseline abduction with 7/10 pain, ER 0/10 pain    Status On-going      PT SHORT TERM GOAL #3   Title Pt will be able to perform A/ROM of Rt shoulder into flexion and scaption without painful arc.    Status On-going             PT Long Term Goals - 10/06/20 1401      PT LONG TERM GOAL #1   Title Pt will be ind with advanced HEP    Time 8    Period Weeks    Status New    Target Date 12/01/20      PT LONG TERM GOAL #2   Title Pt will be able to play a round of golf with min to no pain afterwards.    Time 8    Period Weeks    Status New    Target Date 12/01/20      PT LONG TERM GOAL #3   Title Pt will be able to throw a ball to play catch with his kids with no to min pain.    Baseline -    Time 8    Period Weeks    Status New    Target Date 12/01/20      PT LONG TERM GOAL #4   Title Pt will report at least 70% reduction in pain with Rt UE use both at work and home activities.    Time 8    Period Weeks    Status New    Target Date 12/01/20                 Plan -  10/19/20 0752    Clinical Impression Statement Pt arrived without pain today.  Pt demonstrated all aspects of HEP correctly today with minor cueing for scapular position and alignment.  Pt reports up to 7/10 Rt anterior shoulder pain with reaching and eccentric motion.  Pt reported 3-4/10 Rt shoulder pain with exercise in the clinic today.  Painful arc reported with eccentric motion into flexion and abduction on the Rt.  7/10 Rt shoulder pain with resisted abduction and 0/10 with resisted ER.  Progress toward STGs.  Pt will continue to benefit from skilled PT to address Rt shoulder pain and limited functional use.    PT Frequency 1x / week    PT Duration 8 weeks    PT Treatment/Interventions ADLs/Self Care Home Management;Spinal Manipulations;Joint Manipulations;Passive range of motion;Dry needling;Taping;Iontophoresis 4mg /ml Dexamethasone;Electrical Stimulation;Cryotherapy;Moist Heat;Traction;Therapeutic exercise;Functional mobility training;Patient/family education;Manual techniques    PT Next Visit Plan work on eccentric motion, ionto #3, DN to upper trap and thoracic paraspinals    Consulted and Agree with Plan of Care Patient           Patient will benefit from skilled therapeutic intervention in order to improve the following deficits and impairments:  Decreased range of motion,Hypomobility,Impaired flexibility,Pain,Increased muscle spasms,Impaired UE functional use,Postural dysfunction,Improper body mechanics,Decreased activity tolerance  Visit Diagnosis: Chronic right shoulder pain  Abnormal posture     Problem List Patient Active Problem List   Diagnosis Date Noted  . Prediabetes 08/20/2018  . Class 1 obesity with body mass index (BMI) of 33.0 to 33.9 in adult 08/20/2018  . Chest wall pain 06/10/2018  . Tendinitis of both elbows 08/13/2017  . Allergic rhinitis due to pollen 08/13/2017  . Snoring 08/13/2017  .  Hyperlipidemia 09/11/2016  . BPH associated with nocturia  09/11/2016  . Family history of prostate cancer 10/06/2014  . Routine general medical examination at a health care facility 05/27/2013    Lorrene Ryer, PT 10/19/20 8:01 AM  [facility] [address] [place], [place], [address] Phone: [phone]   Fax:  [phone]  Name: Sean Romero MRN: [id number] Date of Birth: 11-Jan-1971

## 2020-10-28 ENCOUNTER — Other Ambulatory Visit: Payer: Self-pay

## 2020-10-28 ENCOUNTER — Ambulatory Visit: Payer: BC Managed Care – PPO

## 2020-10-28 DIAGNOSIS — G8929 Other chronic pain: Secondary | ICD-10-CM | POA: Diagnosis not present

## 2020-10-28 DIAGNOSIS — R293 Abnormal posture: Secondary | ICD-10-CM | POA: Diagnosis not present

## 2020-10-28 DIAGNOSIS — M25511 Pain in right shoulder: Secondary | ICD-10-CM | POA: Diagnosis not present

## 2020-10-28 NOTE — Therapy (Signed)
[facility] [address] [place], [place], [address] Phone: [phone]   Fax:  [phone]  Physical Therapy Treatment  Patient Details  Name: Sean Romero MRN: [id number] Date of Birth: 1970/10/09 Referring Provider (PT): [place], Betty G, MD   Encounter Date: 10/28/2020   PT End of Session - 10/28/20 0841    Visit Number 4    Date for PT Re-Evaluation 12/01/20    Authorization Type BCBS    PT Start Time 0755    PT Stop Time 0841    PT Time Calculation (min) 46 min    Activity Tolerance Patient tolerated treatment well    Behavior During Therapy [facility] for tasks assessed/performed           Past Medical History:  Diagnosis Date  . Allergy   . Arthritis    mild right shoulder   . Asthma   . 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 Hypertension    pt denies  . Sleep apnea    treated x 90 days now off Cpap     Past Surgical History:  Procedure Laterality Date  . ACHILLES TENDON REPAIR Left    ~ 20 yrs ago   . cyst removed  left knee      There were no vitals filed for this visit.   Subjective Assessment - 10/28/20 0758    Subjective I played golf on Sunday and I had horrible Rt shoulder pain the last 6 holes and for the rest of the day, up to 8/10 pain.    Currently in Pain? No/denies    Aggravating Factors  playing golf, reaching overhead and eccentric motion    Pain Relieving Factors rest, ibuprofen, ice                             [facility] Adult PT Treatment/Exercise - 10/28/20 0001      Neck Exercises: Machines for Strengthening   UBE (Upper Arm Bike) Level 1.5 x 6 minutes (3/3)      Shoulder Exercises: Standing   Flexion Strengthening;Right;10 reps    Flexion Limitations using finger ladder- slides with gentle overpressure      Shoulder Exercises: Pulleys   Flexion 3 minutes      Shoulder  Exercises: Stretch   Corner Stretch 10 seconds;4 reps      Modalities   Modalities Iontophoresis      Iontophoresis   Type of Iontophoresis Dexamethasone    Location Rt shoulder    Dose 1.0 cc   #3   Time 6 hour wear time      Manual Therapy   Manual Therapy Soft tissue mobilization;Myofascial release    Manual therapy comments skilled palpation and monitoring during dry needling    Soft tissue mobilization elongation to middle traps, upper trap and deltoid      Neck Exercises: Stretches   Upper Trapezius Stretch Right;3 reps;20 seconds            Trigger Point Dry Needling - 10/28/20 0001    Consent Given? Yes    Education Handout Provided Previously provided    Muscles Treated Head and Neck Upper trapezius    Muscles Treated Upper Quadrant Deltoid    Dry Needling Comments thoracic multifidi    Upper Trapezius Response Twitch reponse  elicited;Palpable increased muscle length    Deltoid Response Twitch response elicited;Palpable increased muscle length                  PT Short Term Goals - 10/28/20 0800      PT SHORT TERM GOAL #1   Title Pt will be ind with initial HEP    Status Achieved      PT SHORT TERM GOAL #2   Title Pt will be able to perform resisted isom testing of Rt shoulder abd and ER without pain    Baseline abduction with 7/10 pain, ER 0/10 pain    Status On-going      PT SHORT TERM GOAL #3   Title Pt will be able to perform A/ROM of Rt shoulder into flexion and scaption without painful arc.    Status On-going             PT Long Term Goals - 10/06/20 1401      PT LONG TERM GOAL #1   Title Pt will be ind with advanced HEP    Time 8    Period Weeks    Status New    Target Date 12/01/20      PT LONG TERM GOAL #2   Title Pt will be able to play a round of golf with min to no pain afterwards.    Time 8    Period Weeks    Status New    Target Date 12/01/20      PT LONG TERM GOAL #3   Title Pt will be able to throw a ball to play  catch with his kids with no to min pain.    Baseline -    Time 8    Period Weeks    Status New    Target Date 12/01/20      PT LONG TERM GOAL #4   Title Pt will report at least 70% reduction in pain with Rt UE use both at work and home activities.    Time 8    Period Weeks    Status New    Target Date 12/01/20                 Plan - 10/28/20 0807    Clinical Impression Statement Pt arrived without pain today and reports that he had 8/10 Rt shoulder pain while and after playing golf over the weekend.  Pt with tension and trigger points in Rt upper trap, thoracic paraspinals and anterior/lateral deltoid. Pt had multiple twitch responses and reduced tension after dry needling and manual therapy today.  Painful arc reported with eccentric motion into flexion and abduction on the Rt.  Pt will continue to benefit from skilled PT to address Rt shoulder pain and limited functional use.    PT Frequency 1x / week    PT Duration 8 weeks    PT Treatment/Interventions ADLs/Self Care Home Management;Spinal Manipulations;Joint Manipulations;Passive range of motion;Dry needling;Taping;Iontophoresis 4mg /ml Dexamethasone;Electrical Stimulation;Cryotherapy;Moist Heat;Traction;Therapeutic exercise;Functional mobility training;Patient/family education;Manual techniques    PT Next Visit Plan work on eccentric motion, ionto #4, assess response to DN and repeat if good response    Recommended Other Services initial cert is signed    Consulted and Agree with Plan of Care Patient           Patient will benefit from skilled therapeutic intervention in order to improve the following deficits and impairments:  Decreased range of motion,Hypomobility,Impaired flexibility,Pain,Increased muscle spasms,Impaired UE functional use,Postural dysfunction,Improper body mechanics,Decreased  activity tolerance  Visit Diagnosis: Chronic right shoulder pain  Abnormal posture     Problem List Patient Active Problem  List   Diagnosis Date Noted  . Prediabetes 08/20/2018  . Class 1 obesity with body mass index (BMI) of 33.0 to 33.9 in adult 08/20/2018  . Chest wall pain 06/10/2018  . Tendinitis of both elbows 08/13/2017  . Allergic rhinitis due to pollen 08/13/2017  . Snoring 08/13/2017  . Hyperlipidemia 09/11/2016  . BPH associated with nocturia 09/11/2016  . Family history of prostate cancer 10/06/2014  . Routine general medical examination at a health care facility 05/27/2013     Lorrene Laura, PT 10/28/20 8:43 AM  [facility] [address] [place], [place], [address] Phone: [phone]   Fax:  [phone]  Name: Sean Romero MRN: [id number] Date of Birth: 26-Sep-1970

## 2020-11-01 ENCOUNTER — Other Ambulatory Visit: Payer: Self-pay

## 2020-11-01 ENCOUNTER — Ambulatory Visit: Payer: BC Managed Care – PPO

## 2020-11-01 DIAGNOSIS — G8929 Other chronic pain: Secondary | ICD-10-CM

## 2020-11-01 DIAGNOSIS — M25511 Pain in right shoulder: Secondary | ICD-10-CM

## 2020-11-01 DIAGNOSIS — R293 Abnormal posture: Secondary | ICD-10-CM | POA: Diagnosis not present

## 2020-11-01 NOTE — Therapy (Signed)
[facility] [address], [address] [place], [place], [address] Phone: [phone]   Fax:  [phone]  Physical Therapy Treatment  Patient Details  Name: Sean Romero MRN: [id number] Date of Birth: January 23, 1971 Referring Provider (PT): [place], Betty G, MD   Encounter Date: 11/01/2020   PT End of Session - 11/01/20 1649    Visit Number 5    Date for PT Re-Evaluation 12/01/20    Authorization Type BCBS    PT Start Time 1619    PT Stop Time 1649    PT Time Calculation (min) 30 min    Activity Tolerance Other (comment)    Behavior During Therapy [facility] for tasks assessed/performed           Past Medical History:  Diagnosis Date  . Allergy   . Arthritis    mild right shoulder   . Asthma   . 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 Hypertension    pt denies  . Sleep apnea    treated x 90 days now off Cpap     Past Surgical History:  Procedure Laterality Date  . ACHILLES TENDON REPAIR Left    ~ 20 yrs ago   . cyst removed  left knee      There were no vitals filed for this visit.   Subjective Assessment - 11/01/20 1622    Subjective I just had a wisdom tooth pulled and got a filling.  The dentist said to take it easy today.  The DN was good, I was able to play golf the next day without pain.    Currently in Pain? Yes    Pain Score 4     Pain Location Shoulder    Pain Orientation Right;Anterior    Pain Descriptors / Indicators Stabbing;Sharp    Pain Type Chronic pain    Pain Onset More than a month ago    Pain Frequency Intermittent    Aggravating Factors  playing golf, reaching overhead and eccentric motion    Pain Relieving Factors rest, ibuprofen, ice                             [facility] Adult PT Treatment/Exercise - 11/01/20 0001      Neck Exercises: Machines for Strengthening   UBE (Upper Arm Bike) Level 1 x  6 minutes (3/3)   reduced level due to just had dental work     Shoulder Exercises: Seated   Flexion Strengthening;Right;10 reps;Weights    Flexion Weight (lbs) 1      Shoulder Exercises: Standing   Flexion Strengthening;Right;10 reps    Flexion Limitations using finger ladder- slides with gentle overpressure    Extension Strengthening;Both;20 reps;Theraband    Theraband Level (Shoulder Extension) Level 2 (Red)    Row Strengthening;Both;20 reps;Theraband    Theraband Level (Shoulder Row) Level 2 (Red)    Row Limitations improved scapular position today      Shoulder Exercises: Pulleys   Flexion 3 minutes      Shoulder Exercises: Stretch   Corner Stretch 10 seconds;4 reps      Modalities   Modalities Iontophoresis      Iontophoresis   Type of Iontophoresis Dexamethasone    Location Rt shoulder    Dose 1.0 cc   #4   Time 6  hour wear time      Neck Exercises: Stretches   Upper Trapezius Stretch Right;3 reps;20 seconds                    PT Short Term Goals - 11/01/20 1624      PT SHORT TERM GOAL #1   Title Pt will be ind with initial HEP    Status Achieved      PT SHORT TERM GOAL #3   Title Pt will be able to perform A/ROM of Rt shoulder into flexion and scaption without painful arc.    Status On-going      PT SHORT TERM GOAL #4   Title Pt will report at least 30% reduction in Rt shoulder pain with Rt UE use.    Baseline 80%    Status Achieved             PT Long Term Goals - 10/06/20 1401      PT LONG TERM GOAL #1   Title Pt will be ind with advanced HEP    Time 8    Period Weeks    Status New    Target Date 12/01/20      PT LONG TERM GOAL #2   Title Pt will be able to play a round of golf with min to no pain afterwards.    Time 8    Period Weeks    Status New    Target Date 12/01/20      PT LONG TERM GOAL #3   Title Pt will be able to throw a ball to play catch with his kids with no to min pain.    Baseline -    Time 8    Period  Weeks    Status New    Target Date 12/01/20      PT LONG TERM GOAL #4   Title Pt will report at least 70% reduction in pain with Rt UE use both at work and home activities.    Time 8    Period Weeks    Status New    Target Date 12/01/20                 Plan - 11/01/20 1645    Clinical Impression Statement Pt was able to play golf after dry needling last session without pain. Pt reports 80% overall improvement since the start of care.  Pt arrived after having dental work today and reports that the dentist told him to take it easy in treatment today so activity was modified to accommodate this.  Pt with continued Rt shoulder pain laterally with eccentric motion.  Pt required fewer tactile cues for scapular alignment today with theraband attached.  Pt will continue to benefit from skilled PT to address Rt shoulder flexibility, strength and tissue mobility.    PT Frequency 1x / week    PT Duration 8 weeks    PT Treatment/Interventions ADLs/Self Care Home Management;Spinal Manipulations;Joint Manipulations;Passive range of motion;Dry needling;Taping;Iontophoresis 4mg /ml Dexamethasone;Electrical Stimulation;Cryotherapy;Moist Heat;Traction;Therapeutic exercise;Functional mobility training;Patient/family education;Manual techniques    PT Next Visit Plan work on eccentric motion, ionto #5, DN next session if needed    PT Home Exercise Plan DN info    Consulted and Agree with Plan of Care Patient           Patient will benefit from skilled therapeutic intervention in order to improve the following deficits and impairments:  Decreased range of motion,Hypomobility,Impaired flexibility,Pain,Increased muscle spasms,Impaired UE functional use,Postural dysfunction,Improper  body mechanics,Decreased activity tolerance  Visit Diagnosis: Abnormal posture  Chronic right shoulder pain     Problem List Patient Active Problem List   Diagnosis Date Noted  . Prediabetes 08/20/2018  . Class 1  obesity with body mass index (BMI) of 33.0 to 33.9 in adult 08/20/2018  . Chest wall pain 06/10/2018  . Tendinitis of both elbows 08/13/2017  . Allergic rhinitis due to pollen 08/13/2017  . Snoring 08/13/2017  . Hyperlipidemia 09/11/2016  . BPH associated with nocturia 09/11/2016  . Family history of prostate cancer 10/06/2014  . Routine general medical examination at a health care facility 05/27/2013     Lorrene Tarleton, PT 11/01/20 4:50 PM  [facility] [address], [address] [place], [place], [address] Phone: [phone]   Fax:  [phone]  Name: Sean Romero MRN: [id number] Date of Birth: 05-30-1971

## 2020-11-03 ENCOUNTER — Ambulatory Visit: Payer: BC Managed Care – PPO | Admitting: Neurology

## 2020-11-03 ENCOUNTER — Other Ambulatory Visit: Payer: Self-pay

## 2020-11-03 DIAGNOSIS — R200 Anesthesia of skin: Secondary | ICD-10-CM

## 2020-11-03 NOTE — Procedures (Signed)
[facility]  [address], [address]  [place], [place] [address] Tel: [phone] Fax:  [phone] Test Date:  11/03/2020  Patient: Sean Romero DOB: 04/09/1971 Physician: Nita Sickle, DO  Sex: Male Height: 5\' 10"  Ref Phys: Betty , MD  ID#: [id number]   Technician:    Patient Complaints: This is a 50 year old man referred for evaluation of bilateral hand paresthesias.  NCV & EMG Findings: Electrodiagnostic testing was limited and prematurely terminated at patient's request due to pain.    Impression: This is an incomplete study, as testing was terminated at patient's request due to pain.   ___________________________ 57, DO    Nerve Conduction Studies Anti Sensory Summary Table   Stim Site NR Peak (ms) Norm Peak (ms) P-T Amp (V) Norm P-T Amp  Right Median Anti Sensory (2nd Digit)  34C  Wrist    5.6 <3.4 11.5 >20      Waveforms:

## 2020-11-08 ENCOUNTER — Ambulatory Visit: Payer: BC Managed Care – PPO | Attending: Family Medicine

## 2020-11-08 ENCOUNTER — Other Ambulatory Visit: Payer: Self-pay

## 2020-11-08 DIAGNOSIS — G8929 Other chronic pain: Secondary | ICD-10-CM | POA: Diagnosis not present

## 2020-11-08 DIAGNOSIS — M25511 Pain in right shoulder: Secondary | ICD-10-CM | POA: Insufficient documentation

## 2020-11-08 DIAGNOSIS — R293 Abnormal posture: Secondary | ICD-10-CM | POA: Diagnosis not present

## 2020-11-08 NOTE — Therapy (Addendum)
[facility] [address] [place], [place], [address] Phone: [phone]   Fax:  [phone]  Physical Therapy Treatment  Patient Details  Name: Sean Romero MRN: [id number] Date of Birth: 11/10/1970 Referring Provider (PT): [place], Betty G, MD   Encounter Date: 11/08/2020   PT End of Session - 11/08/20 0835     Visit Number 6    Date for PT Re-Evaluation 12/01/20    Authorization Type BCBS    PT Start Time 0752    PT Stop Time 0835    PT Time Calculation (min) 43 min    Activity Tolerance Patient tolerated treatment well    Behavior During Therapy [facility] for tasks assessed/performed             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   Subjective Assessment - 11/08/20 0756     Subjective I played golf yesterday.  My Rt shoulder is sore, but not as sore as usual.  It is 75% better overall.    Patient Stated Goals move arm without pain, play with my young kids, throw a ball/play catch with son, reduced pain after playing golf    Currently in Pain? Yes    Pain Score 4     Pain Location Shoulder    Pain Orientation Right    Pain Descriptors / Indicators Sharp    Pain Type Chronic pain    Pain Onset More than a month ago    Pain Frequency Intermittent    Aggravating Factors  playing golf, reaching overhead motion    Pain Relieving Factors rest, ibuprofen, ice                               [facility] Adult PT Treatment/Exercise - 11/08/20 0001       Neck  Exercises: Machines for Strengthening   UBE (Upper Arm Bike) Level 1 x 6 minutes (3/3)   reduced level due to just had dental work     Shoulder Exercises: Seated   Flexion Strengthening;Right;10 reps;Weights    Flexion Weight (lbs) 1      Shoulder Exercises: Standing   Flexion Strengthening;Right;10 reps    Shoulder Flexion Weight (lbs) 1    Flexion Limitations using finger ladder- slides with gentle overpressure    Extension --    Theraband Level (Shoulder Extension) --    Row --    Theraband Level (Shoulder Row) --    Row Limitations --      Shoulder Exercises: Pulleys   Flexion 3 minutes      Shoulder Exercises: Stretch   Corner Stretch 10 seconds;4 reps      Modalities   Modalities Iontophoresis      Iontophoresis   Type of Iontophoresis Dexamethasone  Location Rt shoulder    Dose 1.0 cc   #5   Time 6 hour wear time      Manual Therapy   Manual Therapy Soft tissue mobilization;Myofascial release    Manual therapy comments skilled palpation and monitoring during dry needling    Soft tissue mobilization elongation to middle traps, upper trap and deltoid              Trigger Point Dry Needling - 11/08/20 0001     Consent Given? Yes    Education Handout Provided Previously provided    Muscles Treated Head and Neck Upper trapezius    Muscles Treated Upper Quadrant Deltoid    Dry Needling Comments thoracic multifidi    Upper Trapezius Response Twitch reponse elicited;Palpable increased muscle length    Deltoid Response Twitch response elicited;Palpable increased muscle length                    PT Short Term Goals - 11/01/20 1624       PT SHORT TERM GOAL #1   Title Pt will be ind with initial HEP    Status Achieved      PT SHORT TERM GOAL #3   Title Pt will be able to perform A/ROM of Rt shoulder into flexion and scaption without painful arc.    Status On-going      PT SHORT TERM GOAL #4   Title Pt will report at least 30% reduction in Rt  shoulder pain with Rt UE use.    Baseline 80%    Status Achieved               PT Long Term Goals - 10/06/20 1401       PT LONG TERM GOAL #1   Title Pt will be ind with advanced HEP    Time 8    Period Weeks    Status New    Target Date 12/01/20      PT LONG TERM GOAL #2   Title Pt will be able to play a round of golf with min to no pain afterwards.    Time 8    Period Weeks    Status New    Target Date 12/01/20      PT LONG TERM GOAL #3   Title Pt will be able to throw a ball to play catch with his kids with no to min pain.    Baseline -    Time 8    Period Weeks    Status New    Target Date 12/01/20      PT LONG TERM GOAL #4   Title Pt will report at least 70% reduction in pain with Rt UE use both at work and home activities.    Time 8    Period Weeks    Status New    Target Date 12/01/20                   Plan - 11/08/20 0810     Clinical Impression Statement Pt reports 80% overall improvement since the start of care and 75% reduction in pain after playing golf.  Pt is making steady progress toward goals.  Pt continues to experience a painful arc with mid range flexion and abduction.  Pt with tension and trigger points in Rt deltoid, upper trap and thoracic multifidi.  Pt demonstrated improved tissue mobility after dry needling and manual therapy.    Pt with continued Rt shoulder pain  laterally with eccentric motion.  Pt required fewer tactile cues for scapular alignment today with theraband attached.  Pt will continue to benefit from skilled PT to address Rt shoulder flexibility, strength and tissue mobility.    PT Frequency 1x / week    PT Duration 8 weeks    PT Treatment/Interventions ADLs/Self Care Home Management;Spinal Manipulations;Joint Manipulations;Passive range of motion;Dry needling;Taping;Iontophoresis 61m/ml Dexamethasone;Electrical Stimulation;Cryotherapy;Moist Heat;Traction;Therapeutic exercise;Functional mobility training;Patient/family  education;Manual techniques    PT Next Visit Plan work on eccentric motion, ionto #6, assess response to DN    PT Home Exercise Plan DN info    Consulted and Agree with Plan of Care Patient             Patient will benefit from skilled therapeutic intervention in order to improve the following deficits and impairments:  Decreased range of motion,Hypomobility,Impaired flexibility,Pain,Increased muscle spasms,Impaired UE functional use,Postural dysfunction,Improper body mechanics,Decreased activity tolerance  Visit Diagnosis: Abnormal posture  Chronic right shoulder pain     Problem List Patient Active Problem List   Diagnosis Date Noted   Prediabetes 08/20/2018   Class 1 obesity with body mass index (BMI) of 33.0 to 33.9 in adult 08/20/2018   Chest wall pain 06/10/2018   Tendinitis of both elbows 08/13/2017   Allergic rhinitis due to pollen 08/13/2017   Snoring 08/13/2017   Hyperlipidemia 09/11/2016   BPH associated with nocturia 09/11/2016   Family history of prostate cancer 10/06/2014   Routine general medical examination at a health care facility 05/27/2013    KSigurd Sos PT 11/08/20 8:39 AM PHYSICAL THERAPY DISCHARGE SUMMARY  Visits from Start of Care: 6  Current functional level related to goals / functional outcomes: Pt didn't return after last session.  See above for most current PT status.    Remaining deficits: See above.     Education / Equipment: HEP   Patient agrees to discharge. Patient goals were not met. Patient is being discharged due to not returning since the last visit.   KSigurd Sos PT 03/01/21 11:47 AM  [facility] [address] [place] [address]Phone: [phone]  Fax:  [phone] Name: Sean MAULTSBYMRN: [id number] of Birth: [birth year]

## 2020-11-09 ENCOUNTER — Telehealth: Payer: Self-pay | Admitting: Physical Therapy

## 2020-11-09 NOTE — Telephone Encounter (Signed)
Patient called office due to 6/10 sharp shoulder pain since last session 4/4. Has not changed activity level. Suggested use of ice (10 minutes on, 10 minutes off). Also suggested sleeping with pillow across chest to better position Rt UE. Advised patient to call office tomorrow morning if pain did not improve. Patient verbalized understanding.

## 2020-11-15 ENCOUNTER — Ambulatory Visit: Payer: BC Managed Care – PPO

## 2020-11-15 ENCOUNTER — Other Ambulatory Visit: Payer: Self-pay

## 2020-11-15 DIAGNOSIS — R293 Abnormal posture: Secondary | ICD-10-CM

## 2020-11-15 NOTE — Therapy (Signed)
Pt arrived for his PT visit today.  He received a phone call while waiting for his appointment and had to leave due to an emergency.  Pt will be called tomorrow to reschedule.    [facility] [address] [place], [place], [address] Phone: [phone]   Fax:  [phone]  Patient Details  Name: Sean Romero MRN: [id number] Date of Birth: 1971-04-23 Referring Provider:  [place], Betty G, MD  Encounter Date: 11/15/2020   [name] 11/15/2020, 4:25 PM  [facility] [address], [address] [place], [place], [address] Phone: [phone]   Fax:  [phone]

## 2020-11-21 ENCOUNTER — Other Ambulatory Visit: Payer: Self-pay | Admitting: Family Medicine

## 2020-11-21 DIAGNOSIS — N401 Enlarged prostate with lower urinary tract symptoms: Secondary | ICD-10-CM

## 2020-12-09 DIAGNOSIS — F331 Major depressive disorder, recurrent, moderate: Secondary | ICD-10-CM | POA: Diagnosis not present

## 2020-12-10 DIAGNOSIS — F331 Major depressive disorder, recurrent, moderate: Secondary | ICD-10-CM | POA: Diagnosis not present

## 2020-12-27 DIAGNOSIS — F331 Major depressive disorder, recurrent, moderate: Secondary | ICD-10-CM | POA: Diagnosis not present

## 2020-12-28 DIAGNOSIS — F331 Major depressive disorder, recurrent, moderate: Secondary | ICD-10-CM | POA: Diagnosis not present

## 2021-01-04 DIAGNOSIS — F331 Major depressive disorder, recurrent, moderate: Secondary | ICD-10-CM | POA: Diagnosis not present

## 2021-01-11 DIAGNOSIS — F331 Major depressive disorder, recurrent, moderate: Secondary | ICD-10-CM | POA: Diagnosis not present

## 2021-01-25 DIAGNOSIS — F331 Major depressive disorder, recurrent, moderate: Secondary | ICD-10-CM | POA: Diagnosis not present

## 2021-01-31 DIAGNOSIS — F331 Major depressive disorder, recurrent, moderate: Secondary | ICD-10-CM | POA: Diagnosis not present

## 2021-02-08 DIAGNOSIS — F331 Major depressive disorder, recurrent, moderate: Secondary | ICD-10-CM | POA: Diagnosis not present

## 2021-02-14 DIAGNOSIS — F331 Major depressive disorder, recurrent, moderate: Secondary | ICD-10-CM | POA: Diagnosis not present

## 2021-02-17 DIAGNOSIS — G244 Idiopathic orofacial dystonia: Secondary | ICD-10-CM | POA: Diagnosis not present

## 2021-02-17 DIAGNOSIS — Z79899 Other long term (current) drug therapy: Secondary | ICD-10-CM | POA: Diagnosis not present

## 2021-02-17 DIAGNOSIS — G245 Blepharospasm: Secondary | ICD-10-CM | POA: Diagnosis not present

## 2021-02-17 DIAGNOSIS — Z880 Allergy status to penicillin: Secondary | ICD-10-CM | POA: Diagnosis not present

## 2021-02-21 DIAGNOSIS — F331 Major depressive disorder, recurrent, moderate: Secondary | ICD-10-CM | POA: Diagnosis not present

## 2021-03-01 DIAGNOSIS — F331 Major depressive disorder, recurrent, moderate: Secondary | ICD-10-CM | POA: Diagnosis not present

## 2021-03-08 DIAGNOSIS — F331 Major depressive disorder, recurrent, moderate: Secondary | ICD-10-CM | POA: Diagnosis not present

## 2021-03-09 DIAGNOSIS — G249 Dystonia, unspecified: Secondary | ICD-10-CM | POA: Diagnosis not present

## 2021-03-09 DIAGNOSIS — M2651 Abnormal jaw closure: Secondary | ICD-10-CM | POA: Diagnosis not present

## 2021-03-15 DIAGNOSIS — F331 Major depressive disorder, recurrent, moderate: Secondary | ICD-10-CM | POA: Diagnosis not present

## 2021-03-24 DIAGNOSIS — F331 Major depressive disorder, recurrent, moderate: Secondary | ICD-10-CM | POA: Diagnosis not present

## 2021-03-28 DIAGNOSIS — F331 Major depressive disorder, recurrent, moderate: Secondary | ICD-10-CM | POA: Diagnosis not present

## 2021-04-04 DIAGNOSIS — F331 Major depressive disorder, recurrent, moderate: Secondary | ICD-10-CM | POA: Diagnosis not present

## 2021-04-14 DIAGNOSIS — F331 Major depressive disorder, recurrent, moderate: Secondary | ICD-10-CM | POA: Diagnosis not present

## 2021-04-21 DIAGNOSIS — F331 Major depressive disorder, recurrent, moderate: Secondary | ICD-10-CM | POA: Diagnosis not present

## 2021-05-03 DIAGNOSIS — F331 Major depressive disorder, recurrent, moderate: Secondary | ICD-10-CM | POA: Diagnosis not present

## 2021-06-09 DIAGNOSIS — F331 Major depressive disorder, recurrent, moderate: Secondary | ICD-10-CM | POA: Diagnosis not present

## 2021-06-15 DIAGNOSIS — G249 Dystonia, unspecified: Secondary | ICD-10-CM | POA: Diagnosis not present

## 2021-06-15 DIAGNOSIS — G245 Blepharospasm: Secondary | ICD-10-CM | POA: Diagnosis not present

## 2021-06-15 DIAGNOSIS — G244 Idiopathic orofacial dystonia: Secondary | ICD-10-CM | POA: Diagnosis not present

## 2021-06-16 DIAGNOSIS — F331 Major depressive disorder, recurrent, moderate: Secondary | ICD-10-CM | POA: Diagnosis not present

## 2021-08-09 ENCOUNTER — Encounter: Payer: Self-pay | Admitting: Family Medicine

## 2021-08-09 ENCOUNTER — Telehealth (INDEPENDENT_AMBULATORY_CARE_PROVIDER_SITE_OTHER): Payer: BC Managed Care – PPO | Admitting: Family Medicine

## 2021-08-09 VITALS — Ht 70.0 in

## 2021-08-09 DIAGNOSIS — R109 Unspecified abdominal pain: Secondary | ICD-10-CM

## 2021-08-09 DIAGNOSIS — A084 Viral intestinal infection, unspecified: Secondary | ICD-10-CM | POA: Diagnosis not present

## 2021-08-09 NOTE — Progress Notes (Signed)
Virtual Visit via Video Note I connected with Sean Romero on 08/09/2021 by a video enabled telemedicine application and verified that I am speaking with the correct person using two identifiers.  Location patient: home Location provider:work office Persons participating in the virtual visit: patient, provider  I discussed the limitations of evaluation and management by telemedicine and the availability of in person appointments. The patient expressed understanding and agreed to proceed.  Chief Complaint  Patient presents with   Diarrhea   Abdominal Pain    Consistent since Saturday. No fever, occasional chills, stomach hurting, no vomiting and no nausea.    HPI: Sean Romero is a 51 yo male with hx of prediabetes,HLD, BPH, and allergies c/o 3 days of abdominal pain and diarrhea as described above. More than 6 stools yesterday, today 3. No blood or mucus in stool. Dull/achy like pain above umbilicus and RLQ, not radiated, intermittent, 6/10. Negative for headache,sore throat,rhinorrhea,nasal congestion,cough,wheezing, SOB,heartburn, jaundice,urinary symptoms, body aches,or skin rash.  He and his wife ate [employer] before symptoms started, his wife did not get sick. He has not tried OTC medications.  Negative for out of state or overseas travel, sick contact,or abx treatment in the past few months. No new medications. In general he is feeling better.   ROS: See pertinent positives and negatives per HPI.  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-  hard to chew at times- has spasms- get [facility]Botox inj from [facility] for this    Hypertension    pt denies   Sleep apnea    treated x 90 days now off Cpap     Past Surgical History:  Procedure Laterality Date   ACHILLES TENDON REPAIR Left    ~ 20 yrs ago    cyst removed  left knee      Family History  Problem Relation Age of Onset    Prostate cancer Father    Colon polyps Mother        TA +    Colon cancer Paternal Grandmother    Esophageal cancer Neg Hx    Rectal cancer Neg Hx    Stomach cancer Neg Hx     Social History   Socioeconomic History   Marital status: Single    Spouse name: Not on file   Number of children: Not on file   Years of education: Not on file   Highest education level: Not on file  Occupational History   Not on file  Tobacco Use   Smoking status: Never   Smokeless tobacco: Never  Vaping Use   Vaping Use: Never used  Substance and Sexual Activity   Alcohol use: Yes    Comment: 1 time a week   Drug use: No   Sexual activity: Not on file  Other Topics Concern   Not on file  Social History Narrative   Not on file   Social Determinants of Health   Financial Resource Strain: Not on file  Food Insecurity: Not on file  Transportation Needs: Not on file  Physical Activity: Not on file  Stress: Not on file  Social Connections: Not on file  Intimate Partner Violence: Not on file    Current Outpatient Medications:    Botulinum Toxin Type A, Cosm, 100 units SOLR, Inject into the muscle., Disp: , Rfl:  tamsulosin (FLOMAX) 0.4 MG CAPS capsule, TAKE 1 CAPSULE(0.4 MG) BY MOUTH DAILY, Disp: 90 capsule, Rfl: 2  EXAM:  VITALS per patient if applicable:Ht 5\' 10"  (1.778 m)    BMI 31.42 kg/m   GENERAL: alert, oriented, appears well and in no acute distress  HEENT: atraumatic, conjunctiva clear, no obvious abnormalities on inspection.  NECK: normal movements of the head and neck  LUNGS: on inspection no signs of respiratory distress, breathing rate appears normal, no obvious gross SOB, gasping or wheezing  CV: no obvious cyanosis  ABDOMEN: pain when he presses on periumbilical area. RLQ pain is elicited by coughing and when applying pressure on RLQ and letting pressure go (McBurney sign?).  MS: moves all visible extremities without noticeable abnormality  PSYCH/NEURO: pleasant  and cooperative, no obvious depression or anxiety, speech and thought processing grossly intact  ASSESSMENT AND PLAN:  Discussed the following assessment and plan:  Abdominal pain, unspecified abdominal location We discussed possible etiologies, including the possibility of acute appendicitis. Hx does not suggest a serious process. Options: ER evaluation now, we can try to arranged stat abdominal/pelvic CT,or monitoring for 1-2 more days. He prefers to hold on imaging and ER evaluation. Instructed to call in 24 hours to let us know if pain has improved. If not greatly improved we will need to consider abdominal CT.If pain gets worse he needs to go to the ER. We discussed warning signs.  Viral colitis Symptoms most likely viral etiology, so symptomatic treatment recommended for now. Monitor for new symptoms. Oral hydration, Gatorade or Pedialyte with water 1:1 ratio are good options, small sips frequently through the day. Bland and light diet, avoid foods that can aggravate symptom. Clearly instructed about warning signs. F/U as needed.  We discussed possible serious and likely etiologies, options for evaluation and workup, limitations of telemedicine visit vs in person visit, treatment, treatment risks and precautions. The patient was advised to call back or seek an in-person evaluation if the symptoms worsen or if the condition fails to improve as anticipated. I discussed the assessment and treatment plan with the patient. Sean Romero was provided an opportunity to ask questions and all were answered. He  agreed with the plan and demonstrated an understanding of the instructions.  Return if symptoms worsen or fail to improve.  Odette Watanabe [place], MD

## 2021-08-10 ENCOUNTER — Telehealth: Payer: Self-pay | Admitting: Family Medicine

## 2021-08-10 NOTE — Telephone Encounter (Signed)
Pt is aware of message below.

## 2021-08-10 NOTE — Telephone Encounter (Signed)
Pt call and stated his stomach feel better and he had 5 rounds of diarrhea .

## 2021-08-10 NOTE — Telephone Encounter (Signed)
Continue adequate hydration. Monitor for new symptoms. If abdominal pain gets any worse he needs to go to the ED. If still having diarrhea next week, we will need labs done. Thanks, BJ

## 2021-08-18 ENCOUNTER — Other Ambulatory Visit: Payer: Self-pay | Admitting: Family Medicine

## 2021-08-18 DIAGNOSIS — N401 Enlarged prostate with lower urinary tract symptoms: Secondary | ICD-10-CM

## 2021-08-24 DIAGNOSIS — G249 Dystonia, unspecified: Secondary | ICD-10-CM | POA: Diagnosis not present

## 2021-08-26 NOTE — Progress Notes (Signed)
HPI: [name] is a 51 y.o.male here today for his routine physical examination.  Last CPE: 08/11/20 He lives with his wife and 2 children.   Regular exercise 3 or more times per week: He has not been as consistent due to cold weather. He walks a lot at work, 6000-7000 steps per day. Following a healthful diet: He drinks a lot of water, 40 oz.  Sleeps 6-8 hours.  Chronic medical problems: HLD, prediabetes, OSA (mild) not on CPAP, jaw dystonia, and allergies among some.  Immunization History  Administered Date(s) Administered   Influenza,inj,Quad PF,6+ Mos 05/27/2013, 09/11/2016, 08/20/2018, 05/13/2019   PFIZER(Purple Top)SARS-COV-2 Vaccination 10/20/2019, 11/11/2019, 06/22/2020   Tdap 12/16/2014, 05/13/2018   Health Maintenance  Topic Date Due   COVID-19 Vaccine (4 - Booster for Pfizer series) 09/14/2021 (Originally 08/17/2020)   INFLUENZA VACCINE  11/04/2021 (Originally 03/07/2021)   HIV Screening  08/07/2022 (Originally 05/10/1986)   TETANUS/TDAP  05/13/2028   COLONOSCOPY (Pts 45-65yrs Insurance coverage will need to be confirmed)  09/10/2030   Hepatitis C Screening  Completed   HPV VACCINES  Aged Out   Last prostate ca screening: 08/12/20 PSA was 2.00 BPH, he is on Flomax 0.4 mg daily, whci has helepd with urinary symptoms.Nocturia x 2. He would like PSA done because father with prostate cancer.  -Negative for high alcohol intake or tobacco use.  -Concerns and/or follow up today:   Follows q 2-3 months with ENT and neuro at [facility] for jaw dystonia, has trouble eating meals, gets tired. He is concerned about wt loss ,so trying to snack after 8 pm. He is being treated with botox injections and since his last visit, Trihexyphenidyl 2 mg was added.  For the past 1-2 months he has had 2 episodes of dull right-sided chest pain for the past month. He wonders if it is gas, he burps frequently, it does not help. No hx of trauma. It is not elicited by exertion and no  associated symptoms. Lateral, bilateral neck pain when having episodes of dystonia.  Review of Systems  Constitutional:  Positive for fatigue. Negative for activity change, appetite change and fever.  HENT:  Negative for mouth sores, nosebleeds, sore throat and trouble swallowing.   Eyes:  Negative for redness and visual disturbance.  Respiratory:  Negative for cough, shortness of breath and wheezing.   Cardiovascular:  Negative for chest pain, palpitations and leg swelling.  Gastrointestinal:  Negative for abdominal pain, blood in stool, nausea and vomiting.  Endocrine: Negative for cold intolerance, heat intolerance, polydipsia, polyphagia and polyuria.  Genitourinary:  Negative for decreased urine volume, dysuria, genital sores, hematuria and testicular pain.  Musculoskeletal:  Negative for gait problem and myalgias.  Skin:  Negative for color change and rash.  Allergic/Immunologic: Positive for environmental allergies.  Neurological:  Negative for syncope, weakness and headaches.  Hematological:  Negative for adenopathy. Does not bruise/bleed easily.  Psychiatric/Behavioral:  Negative for confusion.   All other systems reviewed and are negative.  Current Outpatient Medications on File Prior to Visit  Medication Sig Dispense Refill   Botulinum Toxin Type A, Cosm, 100 units SOLR Inject into the muscle.     tamsulosin (FLOMAX) 0.4 MG CAPS capsule TAKE 1 CAPSULE(0.4 MG) BY MOUTH DAILY 90 capsule 2   trihexyphenidyl (ARTANE) 2 MG tablet Take 2 mg by mouth. 3 times daily with meals     No current facility-administered medications on file prior to visit.   Past Medical History:  Diagnosis Date  Allergy    Arthritis    mild right shoulder    Asthma    Dystonia    of upper and lower jaw due to mask-  hard to chew at times- has spasms- get [facility]Botox [facility] for this    Hypertension    pt denies   Sleep apnea    treated x 90 days now off Cpap    Past Surgical History:  Procedure  Laterality Date   ACHILLES TENDON REPAIR Left    ~ 20 yrs ago    cyst removed  left knee     Allergies  Allergen Reactions   Penicillins Rash   Family History  Problem Relation Age of Onset   Prostate cancer Father    Colon polyps Mother        TA +    Colon cancer Paternal Grandmother    Esophageal cancer Neg Hx    Rectal cancer Neg Hx    Stomach cancer Neg Hx    Social History   Socioeconomic History   Marital status: Single    Spouse name: Not on file   Number of children: Not on file   Years of education: Not on file   Highest education level: Not on file  Occupational History   Not on file  Tobacco Use   Smoking status: Never   Smokeless tobacco: Never  Vaping Use   Vaping Use: Never used  Substance and Sexual Activity   Alcohol use: Yes    Comment: 1 time a week   Drug use: No   Sexual activity: Not on file  Other Topics Concern   Not on file  Social History Narrative   Not on file   Social Determinants of Health   Financial Resource Strain: Not on file  Food Insecurity: Not on file  Transportation Needs: Not on file  Physical Activity: Not on file  Stress: Not on file  Social Connections: Not on file   Vitals:   08/29/21 0824  BP: 124/80  Pulse: 61  Resp: 16  SpO2: 99%   Body mass index is 29.88 kg/m.  Wt Readings from Last 3 Encounters:  08/29/21 208 lb 4 oz (94.5 kg)  09/10/20 219 lb (99.3 kg)  08/26/20 219 lb (99.3 kg)   Physical Exam Vitals and nursing note reviewed.  Constitutional:      General: He is not in acute distress.    Appearance: He is well-developed and well-groomed.  HENT:     Head: Normocephalic and atraumatic.     Right Ear: Tympanic membrane, ear canal and external ear normal.     Left Ear: Tympanic membrane, ear canal and external ear normal.     Mouth/Throat:     Mouth: Mucous membranes are moist.     Pharynx: Oropharynx is clear.     Comments: Some facial muscle with intermittent twitching periocular and  mandible. Eyes:     Extraocular Movements: Extraocular movements intact.     Conjunctiva/sclera: Conjunctivae normal.     Pupils: Pupils are equal, round, and reactive to light.  Neck:     Thyroid: No thyromegaly.     Trachea: No tracheal deviation.  Cardiovascular:     Rate and Rhythm: Normal rate and regular rhythm.     Pulses:          Dorsalis pedis pulses are 2+ on the right side and 2+ on the left side.     Heart sounds: No murmur heard. Pulmonary:  Effort: Pulmonary effort is normal. No respiratory distress.     Breath sounds: Normal breath sounds.  Chest:     Chest wall: No deformity, tenderness or crepitus.  Abdominal:     Palpations: Abdomen is soft. There is no hepatomegaly or mass.     Tenderness: There is no abdominal tenderness.  Genitourinary:    Comments: No concerns. Musculoskeletal:        General: No tenderness.     Cervical back: Normal range of motion.     Comments: No major deformities appreciated and no signs of synovitis.  Lymphadenopathy:     Cervical: No cervical adenopathy.     Upper Body:     Right upper body: No supraclavicular adenopathy.     Left upper body: No supraclavicular adenopathy.  Skin:    General: Skin is warm.     Findings: No erythema.  Neurological:     General: No focal deficit present.     Mental Status: He is alert and oriented to person, place, and time.     Cranial Nerves: No cranial nerve deficit.     Sensory: No sensory deficit.     Gait: Gait normal.     Deep Tendon Reflexes:     Reflex Scores:      Bicep reflexes are 2+ on the right side and 2+ on the left side.      Patellar reflexes are 2+ on the right side and 2+ on the left side. Psychiatric:        Mood and Affect: Mood and affect normal.   ASSESSMENT AND PLAN:  Mr.Shannon was seen today for annual exam.  Diagnoses and all orders for this visit: Orders Placed This Encounter  Procedures   Basic Metabolic Panel   PSA   Hemoglobin A1c   Lipid panel    Lab Results  Component Value Date   HGBA1C 5.6 08/29/2021   Lab Results  Component Value Date   CREATININE 0.93 08/29/2021   BUN 12 08/29/2021   NA 139 08/29/2021   K 4.2 08/29/2021   CL 104 08/29/2021   CO2 29 08/29/2021   Lab Results  Component Value Date   CHOL 214 (H) 08/29/2021   HDL 58.20 08/29/2021   LDLCALC 143 (H) 08/29/2021   LDLDIRECT 138.9 05/27/2013   TRIG 68.0 08/29/2021   CHOLHDL 4 08/29/2021   Lab Results  Component Value Date   PSA 1.24 08/29/2021   Routine general medical examination at a health care facility We discussed the importance of regular physical activity and healthy diet for prevention of chronic illness and/or complications. Preventive guidelines reviewed. Vaccination up to date. Next CPE in a year. The 10-year ASCVD risk score (Arnett DK, et al., 2019) is: 4.4%   Values used to calculate the score:     Age: 33 years     Sex: Male     Is Non-Hispanic African American: Yes     Diabetic: No     Tobacco smoker: No     Systolic Blood Pressure: [id number] mmHg     Is BP treated: No     HDL Cholesterol: 58.2 mg/dL     Total Cholesterol: 214 mg/dL  Screening for endocrine, metabolic and immunity disorder -     Hemoglobin A1c -     Basic Metabolic Panel  Right-sided chest pain We discussed possible etiologies. Hx and examination do not suggest a serious process. ? Musculoskeletal. I do not think further work up is needed at this time.  Instructed about warning signs.  BPH associated with nocturia Flomax 0.4 mg daily still helping, so no changes.  Hyperlipidemia Non pharmacologic treatment recommended for now. Further recommendations will be given according to 10 years CVD risk score and lipid panel numbers.  Dystonia, unspecified Following with ENT and neurologist.  Return in 1 year (on 08/29/2022) for CPE.  Rileyann Florance G. Martinique, MD  [facility]. [place] office.

## 2021-08-29 ENCOUNTER — Encounter: Payer: Self-pay | Admitting: Family Medicine

## 2021-08-29 ENCOUNTER — Telehealth: Payer: Self-pay | Admitting: Family Medicine

## 2021-08-29 ENCOUNTER — Ambulatory Visit (INDEPENDENT_AMBULATORY_CARE_PROVIDER_SITE_OTHER): Payer: BC Managed Care – PPO | Admitting: Family Medicine

## 2021-08-29 VITALS — BP 124/80 | HR 61 | Resp 16 | Ht 70.0 in | Wt 208.2 lb

## 2021-08-29 DIAGNOSIS — E785 Hyperlipidemia, unspecified: Secondary | ICD-10-CM | POA: Diagnosis not present

## 2021-08-29 DIAGNOSIS — Z13228 Encounter for screening for other metabolic disorders: Secondary | ICD-10-CM | POA: Diagnosis not present

## 2021-08-29 DIAGNOSIS — R079 Chest pain, unspecified: Secondary | ICD-10-CM

## 2021-08-29 DIAGNOSIS — G249 Dystonia, unspecified: Secondary | ICD-10-CM | POA: Insufficient documentation

## 2021-08-29 DIAGNOSIS — N401 Enlarged prostate with lower urinary tract symptoms: Secondary | ICD-10-CM

## 2021-08-29 DIAGNOSIS — R351 Nocturia: Secondary | ICD-10-CM

## 2021-08-29 DIAGNOSIS — Z0000 Encounter for general adult medical examination without abnormal findings: Secondary | ICD-10-CM | POA: Diagnosis not present

## 2021-08-29 DIAGNOSIS — Z1329 Encounter for screening for other suspected endocrine disorder: Secondary | ICD-10-CM

## 2021-08-29 DIAGNOSIS — Z130 Encounter for screening for diseases of the blood and blood-forming organs and certain disorders involving the immune mechanism: Secondary | ICD-10-CM | POA: Diagnosis not present

## 2021-08-29 LAB — BASIC METABOLIC PANEL
BUN: 12 mg/dL (ref 6–23)
CO2: 29 mEq/L (ref 19–32)
Calcium: 9.6 mg/dL (ref 8.4–10.5)
Chloride: 104 mEq/L (ref 96–112)
Creatinine, Ser: 0.93 mg/dL (ref 0.40–1.50)
GFR: 97.24 mL/min (ref >60.00)
Glucose, Bld: 92 mg/dL (ref 70–99)
Potassium: 4.2 mEq/L (ref 3.5–5.1)
Sodium: 139 mEq/L (ref 135–145)

## 2021-08-29 LAB — LIPID PANEL
Cholesterol: 214 mg/dL — ABNORMAL HIGH (ref 0–200)
HDL: 58.2 mg/dL (ref >39.00)
LDL Cholesterol: 143 mg/dL — ABNORMAL HIGH (ref 0–99)
NonHDL: 156.27
Total CHOL/HDL Ratio: 4
Triglycerides: 68 mg/dL (ref 0.0–149.0)
VLDL: 13.6 mg/dL (ref 0.0–40.0)

## 2021-08-29 LAB — HEMOGLOBIN A1C: Hgb A1c MFr Bld: 5.6 % (ref 4.6–6.5)

## 2021-08-29 LAB — PSA: PSA: 1.24 ng/mL (ref 0.10–4.00)

## 2021-08-29 NOTE — Assessment & Plan Note (Signed)
Non pharmacologic treatment recommended for now. Further recommendations will be given according to 10 years CVD risk score and lipid panel numbers. 

## 2021-08-29 NOTE — Patient Instructions (Addendum)
A few things to remember from today's visit:  Routine general medical examination at a health care facility  Screening for endocrine, metabolic and immunity disorder - Plan: Basic Metabolic Panel, Hemoglobin A1c  BPH associated with nocturia - Plan: PSA  Hyperlipidemia, unspecified hyperlipidemia type - Plan: Lipid panel  If you need refills please call your pharmacy. Do not use My Chart to request refills or for acute issues that need immediate attention.   Chest pain sounds like muscle. Monitor for new symptoms.  Please be sure medication list is accurate. If a new problem present, please set up appointment sooner than planned today.  Health Maintenance, Male Adopting a healthy lifestyle and getting preventive care are important in promoting health and wellness. Ask your health care provider about: The right schedule for you to have regular tests and exams. Things you can do on your own to prevent diseases and keep yourself healthy. What should I know about diet, weight, and exercise? Eat a healthy diet  Eat a diet that includes plenty of vegetables, fruits, low-fat dairy products, and lean protein. Do not eat a lot of foods that are high in solid fats, added sugars, or sodium. Maintain a healthy weight Body mass index (BMI) is a measurement that can be used to identify possible weight problems. It estimates body fat based on height and weight. Your health care provider can help determine your BMI and help you achieve or maintain a healthy weight. Get regular exercise Get regular exercise. This is one of the most important things you can do for your health. Most adults should: Exercise for at least 150 minutes each week. The exercise should increase your heart rate and make you sweat (moderate-intensity exercise). Do strengthening exercises at least twice a week. This is in addition to the moderate-intensity exercise. Spend less time sitting. Even light physical activity can be  beneficial. Watch cholesterol and blood lipids Have your blood tested for lipids and cholesterol at 49 years of age, then have this test every 5 years. You may need to have your cholesterol levels checked more often if: Your lipid or cholesterol levels are high. You are older than 49 years of age. You are at high risk for heart disease. What should I know about cancer screening? Many types of cancers can be detected early and may often be prevented. Depending on your health history and family history, you may need to have cancer screening at various ages. This may include screening for: Colorectal cancer. Prostate cancer. Skin cancer. Lung cancer. What should I know about heart disease, diabetes, and high blood pressure? Blood pressure and heart disease High blood pressure causes heart disease and increases the risk of stroke. This is more likely to develop in people who have high blood pressure readings or are overweight. Talk with your health care provider about your target blood pressure readings. Have your blood pressure checked: Every 3-5 years if you are 68-8 years of age. Every year if you are 63 years old or older. If you are between the ages of 36 and 55 and are a current or former smoker, ask your health care provider if you should have a one-time screening for abdominal aortic aneurysm (AAA). Diabetes Have regular diabetes screenings. This checks your fasting blood sugar level. Have the screening done: Once every three years after age 53 if you are at a normal weight and have a low risk for diabetes. More often and at a younger age if you are overweight or have  a high risk for diabetes. What should I know about preventing infection? Hepatitis B If you have a higher risk for hepatitis B, you should be screened for this virus. Talk with your health care provider to find out if you are at risk for hepatitis B infection. Hepatitis C Blood testing is recommended for: Everyone  born from [birth year] through 1963. Anyone with known risk factors for hepatitis C. Sexually transmitted infections (STIs) You should be screened each year for STIs, including gonorrhea and chlamydia, if: You are sexually active and are younger than 49 years of age. You are older than 49 years of age and your health care provider tells you that you are at risk for this type of infection. Your sexual activity has changed since you were last screened, and you are at increased risk for chlamydia or gonorrhea. Ask your health care provider if you are at risk. Ask your health care provider about whether you are at high risk for HIV. Your health care provider may recommend a prescription medicine to help prevent HIV infection. If you choose to take medicine to prevent HIV, you should first get tested for HIV. You should then be tested every 3 months for as long as you are taking the medicine. Follow these instructions at home: Alcohol use Do not drink alcohol if your health care provider tells you not to drink. If you drink alcohol: Limit how much you have to 0-2 drinks a day. Know how much alcohol is in your drink. In the [place] one drink equals one 12 oz bottle of beer (355 mL), one 5 oz glass of wine (148 mL), or one 1 oz glass of hard liquor (44 mL). Lifestyle Do not use any products that contain nicotine or tobacco. These products include cigarettes, chewing tobacco, and vaping devices, such as e-cigarettes. If you need help quitting, ask your health care provider. Do not use street drugs. Do not share needles. Ask your health care provider for help if you need support or information about quitting drugs. General instructions Schedule regular health, dental, and eye exams. Stay current with your vaccines. Tell your health care provider if: You often feel depressed. You have ever been abused or do not feel safe at home. Summary Adopting a healthy lifestyle and getting preventive care are important  in promoting health and wellness. Follow your health care provider's instructions about healthy diet, exercising, and getting tested or screened for diseases. Follow your health care provider's instructions on monitoring your cholesterol and blood pressure. This information is not intended to replace advice given to you by your health care provider. Make sure you discuss any questions you have with your health care provider. Document Revised: 12/13/2020 Document Reviewed: 12/13/2020 Elsevier Patient Education  2022 [employer].

## 2021-08-29 NOTE — Assessment & Plan Note (Signed)
Flomax 0.4 mg daily still helping, so no changes.

## 2021-08-29 NOTE — Telephone Encounter (Signed)
Patient stated that the greater the stress and increase in anxiety, the more he have spasms in his jaw. Patient stated that ENT in [place] advised him to discuss this with his PCP.  Patient is requesting to be contacted at [phone].  Please advise.

## 2021-08-29 NOTE — Assessment & Plan Note (Signed)
Following with ENT and neurologist.

## 2021-08-30 NOTE — Telephone Encounter (Signed)
I spoke with patient. Virtual appointment set up for Tuesday to discuss options.

## 2021-08-30 NOTE — Telephone Encounter (Signed)
We did not discussed anxiety. If related to jaw/facial movement disorder, I am not sure if anxiety medication would help. He can arrange appt if he wants to discussed pharmacologic treatment for anxiety or he can try psychotherapy instead and follow if it does not help. Thanks, BJ

## 2021-09-06 ENCOUNTER — Telehealth (INDEPENDENT_AMBULATORY_CARE_PROVIDER_SITE_OTHER): Payer: BC Managed Care – PPO | Admitting: Family Medicine

## 2021-09-06 ENCOUNTER — Encounter: Payer: Self-pay | Admitting: Family Medicine

## 2021-09-06 VITALS — Ht 70.0 in

## 2021-09-06 DIAGNOSIS — G249 Dystonia, unspecified: Secondary | ICD-10-CM | POA: Diagnosis not present

## 2021-09-06 DIAGNOSIS — F419 Anxiety disorder, unspecified: Secondary | ICD-10-CM | POA: Diagnosis not present

## 2021-09-06 MED ORDER — DIAZEPAM 5 MG PO TABS: 5.0000 mg | ORAL_TABLET | Freq: Every day | ORAL | 0 refills | Status: DC | PRN

## 2021-09-06 MED ORDER — SERTRALINE HCL 25 MG PO TABS: 25.0000 mg | ORAL_TABLET | Freq: Every day | ORAL | 3 refills | Status: DC

## 2021-09-06 NOTE — Assessment & Plan Note (Signed)
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 I am not certain a SSRI alone will help.   We discussed different options as well as side effects, he agrees with trying low-dose sertraline, 25 mg daily. Valium 5 mg daily as needed may also help, we discussed side effects.  CBT is important and strongly recommended, contact information given,so he can arrange appt. Follow-up in 2 months, before if needed.

## 2021-09-06 NOTE — Assessment & Plan Note (Signed)
I am not sure if speech therapy can help with this problem given the fact he is not having dysphagia.  Recommend trying to get smaller bites at the time and chew well before swallowing. Valium may help with muscle relaxation, we discussed some side effects. Following with neuro and ENT.

## 2021-09-06 NOTE — Progress Notes (Signed)
Virtual Visit via Video Note I connected with Sean Romero on 09/06/21 by a video enabled telemedicine application and verified that I am speaking with the correct person using two identifiers.  Location patient: [employer] office Persons participating in the virtual visit: patient, provider  I discussed the limitations of evaluation and management by telemedicine and the availability of in person appointments. The patient expressed understanding and agreed to proceed.  Chief Complaint  Patient presents with   Anxiety   HPI: Sean Romero is a 51 yo male with hx of hyperlipidemia, dystonia, and BPH c/o possible anxiety for the past few months. According to patient, his wife thinks he has anxiety caused by dystonia symptoms and that he may benefit from medication. He feels like symptoms are exacerbated by work stress, facial muscles feel fatigue at the end of the day. Anxiety is worse if he has to participate in meetings or situations when he has to talk. Isolated oromandibular dystonia and blepharospasm, he follows with neuro and ENT, receives Botox injections regularly.  According to patient, at some point he has neurology recommended speech therapy. He has trouble chewing food  and sometimesc swallowshewi big pieces.ng, gets tired and swallows big pieces of food. Negative for dysphagia. Negative for depression. He has not tried medications for anxiety in the past.  ROS: See pertinent positives and negatives per HPI.  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-  hard to chew at times- has spasms- [facility]    Hypertension    pt denies   Sleep apnea    treated x 90 days now off Cpap    Past Surgical History:  Procedure Laterality Date   ACHILLES TENDON REPAIR Left    ~ 20 yrs ago    cyst removed  left knee     Family History  Problem  Relation Age of Onset   Prostate cancer Father    Colon polyps Mother        TA +    Colon cancer Paternal Grandmother    Esophageal cancer Neg Hx    Rectal cancer Neg Hx    Stomach cancer Neg Hx    Social History   Socioeconomic History   Marital status: Single    Spouse name: Not on file   Number of children: Not on file   Years of education: Not on file   Highest education level: Not on file  Occupational History   Not on file  Tobacco Use   Smoking status: Never   Smokeless tobacco: Never  Vaping Use   Vaping Use: Never used  Substance and Sexual Activity   Alcohol use: Yes    Comment: 1 time a week   Drug use: No   Sexual activity: Not on file  Other Topics Concern   Not on file  Social History Narrative   Not on file   Social Determinants of Health   Financial Resource Strain: Not on file  Food Insecurity: Not on file  Transportation Needs: Not on file  Physical Activity: Not on file  Stress: Not on file  Social Connections: Not on file  Intimate Partner Violence: Not on file   Current Outpatient Medications:    Botulinum Toxin Type A, Cosm, 100 units SOLR, Inject into the muscle., Disp: ,  Rfl:    tamsulosin (FLOMAX) 0.4 MG CAPS capsule, TAKE 1 CAPSULE(0.4 MG) BY MOUTH DAILY, Disp: 90 capsule, Rfl: 2   trihexyphenidyl (ARTANE) 2 MG tablet, Take 2 mg by mouth. 3 times daily with meals, Disp: , Rfl:   EXAM:  VITALS per patient if applicable:Ht 5\' 10"  (1.778 m)    BMI 29.88 kg/m   GENERAL: alert, oriented, appears well and in no acute distress  HEENT: atraumatic, conjunctiva clear, no obvious abnormalities on inspection.Intermittent twitching of perioral and periocular muscles during visit.  LUNGS: on inspection no signs of respiratory distress, breathing rate appears normal, no obvious gross SOB, gasping or wheezing  CV: no obvious cyanosis  MS: moves all visible extremities without noticeable abnormality  PSYCH/NEURO: pleasant and cooperative, no  obvious depression or anxiety, speech and thought processing grossly intact  ASSESSMENT AND PLAN:  Discussed the following assessment and plan:  Dystonia, unspecified I am not sure if speech therapy can help with this problem given the fact he is not having dysphagia.  Recommend trying to get smaller bites at the time and chew well before swallowing. Valium may help with muscle relaxation, we discussed some side effects. Following with neuro and ENT.  Anxiety disorder Problem is exacerbate by orofacial dystonia, appropriate treatment of this condition is the main part of the treatment. I am not certain a SSRI alone will help.   We discussed different options as well as side effects, he agrees with trying low-dose sertraline, 25 mg daily. Valium 5 mg daily as needed may also help, we discussed side effects.  CBT is important and strongly recommended, contact information given,so he can arrange appt. Follow-up in 2 months, before if needed.  We discussed possible serious and likely etiologies, options for evaluation and workup, limitations of telemedicine visit vs in person visit, treatment, treatment risks and precautions.  I discussed the assessment and treatment plan with the patient. The patient was provided an opportunity to ask questions and all were answered. The patient agreed with the plan and demonstrated an understanding of the instructions.   Return in about 2 months (around 11/04/2021).  [name] 11/06/2021, MD  [facility]. [place] office.

## 2021-09-14 ENCOUNTER — Other Ambulatory Visit: Payer: Self-pay

## 2021-09-14 ENCOUNTER — Ambulatory Visit: Payer: BC Managed Care – PPO | Attending: Family Medicine

## 2021-09-14 DIAGNOSIS — R1311 Dysphagia, oral phase: Secondary | ICD-10-CM | POA: Insufficient documentation

## 2021-09-14 DIAGNOSIS — R471 Dysarthria and anarthria: Secondary | ICD-10-CM | POA: Insufficient documentation

## 2021-09-14 DIAGNOSIS — G244 Idiopathic orofacial dystonia: Secondary | ICD-10-CM | POA: Insufficient documentation

## 2021-09-14 NOTE — Therapy (Signed)
[facility] [address], [address] [place], [place], [address] Phone: [phone]   Fax:  [phone]  Speech Language Pathology Evaluation  Patient Details  Name: Sean Romero MRN: [id number] Date of Birth: 1970-12-21 Referring Provider (SLP): [place], Betty, MD   Encounter Date: 09/14/2021   End of Session - 09/14/21 1146     Visit Number 1    Number of Visits 1    Date for SLP Re-Evaluation 09/14/21    SLP Start Time 0851    SLP Stop Time  0940    SLP Time Calculation (min) 49 min    Activity Tolerance Patient tolerated treatment well;Patient limited by fatigue   jaw fatigue            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       SLP Evaluation [facility] - 09/14/21 6384       SLP Visit Information   SLP Received On 09/14/21    Referring Provider (SLP) [place], Betty, MD    Onset Date summer 2020    Medical Diagnosis Jaw Dystonia      Subjective   Patient/Family Stated Goal "get some relief"      General Information   HPI Pt with jaw dystonia first dx'd by Dr.Tat in January 2021, referred to ENT ([place]) at [facility] who has performed botox injections since 2021. Pt finds these "*somewhat* helpful", but dystonias are exacerbated by anxiety, stress, and fatigue. Pt also ahs difficulty with oral containment with POs as pt's dystonias are violent enough at times to encourage bolus to exit oral cavity. Pt has gotten into habit of eating with a napkin in front of his mouth to assist keeping bolus in  the oral cavity. Pt shared with SLP "Dr. Seward [facility]) thinks it's a neurological response to wearing the mask. That's her theory."      Balance Screen   Has the patient fallen in the past 6 months No      Prior Functional Status   Cognitive/Linguistic Baseline Within functional limits      Cognition   Overall Cognitive Status Within Functional Limits for tasks assessed      Auditory Comprehension   Overall Auditory Comprehension Appears within functional limits for tasks assessed      Oral Motor/Sensory Function   Overall Oral Motor/Sensory Function Impaired    Labial ROM Within Functional Limits    Labial Strength Other (Comment)   difficult to accurately assess due to jaw dystonia   Labial Coordination Reduced   due to jaw movement - does not affect intelligibility   Lingual Strength Within Functional Limits    Lingual Coordination WFL    Facial Symmetry Other (Comment)   difficult to assess due to dystonias   Facial Coordination Reduced   due to dystonia/s  Mandible Impaired   extraneous movement regularly     Motor Speech   Overall Motor Speech Impaired    Phonation Normal    Articulation Impaired   mild due to dystonia   Level of Impairment Word    Intelligibility Intelligible    Interfering Components --   dystonia in jaw                            SLP Education - 09/14/21 1145     Education Details see pt instructions    Person(s) Educated Patient    Methods Explanation;Handout    Comprehension Verbalized understanding                  Plan - 09/14/21 1146     Clinical Impression Statement Pt presents today with intermittent frequency and varied amplitude of jaw dystonia - there was a period of approx 75 seconds that no dystonia was seen in this 45 minute evaluation today. The greatest amplitude of dystonia and frequency of dystonia was seen when pt was eating 3 bites of Nutri-Grain bar and drinking water. His bite size was  moderate, not small, and pt stated he swallowed some largely unchewed pieces. SLP encouraged him to routinely take smaller bite sizes than what was observed today. Cup drinking was not seen as difficult for pt to achieve labial closure. No coughing or throat clearing during any PO consumption today. Pt has been concerned due to weight loss - see "pt instructions" for ideas provided by SLP for pt to improve this problem. Mild and rare articulatory differences not hindering pt intelligibilty were heard - more distracting for the listener were the dystonias themselves. Pt is concerned if he were ever to change jobs he would need to interview with these extraneous and distracting movements. Although precise oral-motor assessment was challenging to accomplish due to dystonias, no gross abnormalities in lingual and labial strength or ROM were appreciated. Phonation was judged as WNL. At this time, SLP provided pt with some ideas for further research for reducing or eliminating dystonias including myofascial release, massage therapy, and/or chiropractic services. 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 Pt also has tried counseling for stress reduction for a period of approx 6 months, which he did not think was very successful in reducing dystonia amplitude or frequency. SLP does not think pt would benefit from skilled ST at this time, and could consider other options presented by SLP today (see "pt instructions").    Speech Therapy Frequency One time visit    Consulted and Agree with Plan of Care Patient             Patient will benefit from skilled therapeutic intervention in order to improve the following deficits and impairments:   Dysarthria  Orofacial dystonia - Plan: SLP plan of care  cert/re-cert  Dysphagia, oral phase    Problem List Patient Active Problem List   Diagnosis Date Noted   Anxiety disorder 09/06/2021   Dystonia, unspecified 08/29/2021   Prediabetes 08/20/2018   Class 1 obesity with body mass index (BMI) of 33.0 to 33.9 in adult 08/20/2018   Chest wall pain 06/10/2018   Tendinitis of both elbows 08/13/2017   Allergic rhinitis due to pollen 08/13/2017  Snoring 08/13/2017   Hyperlipidemia 09/11/2016   BPH associated with nocturia 09/11/2016   Family history of prostate cancer 10/06/2014   Routine general medical examination at a health care facility 05/27/2013    [facility], CCC-SLP 09/14/2021, 12:10 PM  [facility] [address] [place], [place], [address] Phone: [phone]   Fax:  [phone]  Name: Sean Romero MRN: [id number] Date of Birth: 06-06-1971

## 2021-09-14 NOTE — Patient Instructions (Signed)
°  Myofascial release therapy? You'll hear back from me 7-10 days if someone who's certified in it thinks it may assist you.  Chiropracty?  Massage?  Anti-anxiety med to reduce/eliminate the wide exacerbations?  Consuming high calorie, high protein shakes at mealtimes as a supplement to make up some of the calorie/weight loss  Consuming most calorie dense meals in AM and at noon to max calorie intake so when you're fatigued in the PMs you don't need as many calories  Smaller bites so don't need to chew as much and able to keep the food in your mouth

## 2021-09-14 NOTE — Addendum Note (Signed)
Addended by: Verdie Mosher B on: 09/14/2021 12:11 PM   Modules accepted: Orders

## 2021-09-28 DIAGNOSIS — Z5181 Encounter for therapeutic drug level monitoring: Secondary | ICD-10-CM | POA: Diagnosis not present

## 2021-09-28 DIAGNOSIS — G249 Dystonia, unspecified: Secondary | ICD-10-CM | POA: Diagnosis not present

## 2021-09-28 DIAGNOSIS — G244 Idiopathic orofacial dystonia: Secondary | ICD-10-CM | POA: Diagnosis not present

## 2021-09-28 DIAGNOSIS — G245 Blepharospasm: Secondary | ICD-10-CM | POA: Diagnosis not present

## 2021-09-28 DIAGNOSIS — Z79899 Other long term (current) drug therapy: Secondary | ICD-10-CM | POA: Diagnosis not present

## 2021-12-07 NOTE — Progress Notes (Signed)
? ?HPI: ?[name] is a 51 y.o. male, who is here today for follow up. ?He was last seen on 09/06/21, virtual visit.  ?Last visit Zoloft 25 mg daily and Valium 5 mg daily prn were started to help with oromandibular dystonia and anxiety. ?Since his last visit he has seen neurologist and his ENT. ?A couple times he almost "chocked with food, negative for dysphagia. ?He has been evaluated by speech therapy as well and it was recommended to see a "myofacial release" specialist. He has contact information and planning on arranging appt. ? ? ?  12/09/2021  ?  9:25 AM 08/29/2021  ?  8:31 AM 08/11/2020  ?  2:45 PM  ?Depression screen PHQ 2/9  ?Decreased Interest 0 0 0  ?Down, Depressed, Hopeless 0 0 0  ?PHQ - 2 Score 0 0 0  ? ?"Heavy gravity", sleepy feeling that last about 30 min and starts about 20 minutes after taking Sertraline. He is not sure if it is sertraline or Trihexyphenidyl , he takes both meds together but the latter one he has taken for months and at same dose. ? ?He also noted worsening nocturia since he started Sertraline. Negative for dysuria,gross hematuria,or urgency. ?He is sleeping 5-8 hours. ? ?BPH on Flomax 0.4 mg daily. ?Lab Results  ?Component Value Date  ? PSA 1.24 08/29/2021  ? PSA 2.00 08/12/2020  ? PSA 1.21 08/20/2018  ? ? ? ?Review of Systems  ?Constitutional:  Negative for chills and fever.  ?HENT:  Negative for mouth sores and sore throat.   ?Respiratory:  Negative for cough, shortness of breath and wheezing.   ?Gastrointestinal:  Negative for abdominal distention, nausea and vomiting.  ?Endocrine: Negative for cold intolerance and heat intolerance.  ?Genitourinary:  Negative for flank pain, penile discharge and testicular pain.  ?Neurological:  Negative for syncope and headaches.  ?Psychiatric/Behavioral:  Negative for confusion.   ?Rest see pertinent positives and negatives per HPI. ? ?Current Outpatient Medications on File Prior to Visit  ?Medication Sig Dispense Refill  ? Botulinum  Toxin Type A, Cosm, 100 units SOLR Inject into the muscle.    ? tamsulosin (FLOMAX) 0.4 MG CAPS capsule TAKE 1 CAPSULE(0.4 MG) BY MOUTH DAILY 90 capsule 2  ? trihexyphenidyl (ARTANE) 2 MG tablet Take 2 mg by mouth. 3 times daily with meals    ? ?No current facility-administered medications on file prior to visit.  ? ?Past Medical History:  ?Diagnosis Date  ? Allergy   ? Arthritis   ? mild right shoulder   ? Asthma   ? Dystonia   ? of upper and lower jaw due to mask-  hard to chew at times- has spasms- get [facility]Botox inj from [facility] for this   ? Hypertension   ? pt denies  ? Sleep apnea   ? treated x 90 days now off Cpap   ? ?Allergies  ?Allergen Reactions  ? Penicillins Rash  ? ?Social History  ? ?Socioeconomic History  ? Marital status: Single  ?  Spouse name: Not on file  ? Number of children: Not on file  ? Years of education: Not on file  ? Highest education level: Not on file  ?Occupational History  ? Not on file  ?Tobacco Use  ? Smoking status: Never  ? Smokeless tobacco: Never  ?Vaping Use  ? Vaping Use: Never used  ?Substance and Sexual Activity  ? Alcohol use: Yes  ?  Comment: 1 time a week  ? Drug use: No  ?  Sexual activity: Not on file  ?Other Topics Concern  ? Not on file  ?Social History Narrative  ? Not on file  ? ?Social Determinants of Health  ? ?Financial Resource Strain: Not on file  ?Food Insecurity: Not on file  ?Transportation Needs: Not on file  ?Physical Activity: Not on file  ?Stress: Not on file  ?Social Connections: Not on file  ? ?Vitals:  ? 12/09/21 0916  ?BP: 128/74  ?Pulse: 65  ?Resp: 16  ?SpO2: 97%  ? ?Body mass index is 29.58 kg/m?. ? ?Physical Exam ?Vitals and nursing note reviewed.  ?Constitutional:   ?   General: He is not in acute distress. ?   Appearance: He is well-developed and well-groomed.  ?HENT:  ?   Head: Normocephalic and atraumatic.  ?Eyes:  ?   Conjunctiva/sclera: Conjunctivae normal.  ?Cardiovascular:  ?   Rate and Rhythm: Normal rate and regular rhythm.  ?   Heart sounds: No  murmur heard. ?Pulmonary:  ?   Effort: Pulmonary effort is normal. No respiratory distress.  ?   Breath sounds: Normal breath sounds.  ?Abdominal:  ?   Palpations: Abdomen is soft. There is no hepatomegaly or mass.  ?   Tenderness: There is no abdominal tenderness.  ?Skin: ?   General: Skin is warm.  ?   Findings: No erythema.  ?Neurological:  ?   General: No focal deficit present.  ?   Mental Status: He is alert and oriented to person, place, and time.  ?   Gait: Gait normal.  ?   Comments: Perioral/mandibular intermittent muscles twitching/contractions.  ?Psychiatric:     ?   Mood and Affect: Mood and affect normal.  ? ?ASSESSMENT AND PLAN: ? ?Mr.Juvenal was seen today for follow-up. ? ?Diagnoses and all orders for this visit: ? ?BPH associated with nocturia ?Urinary frequency is not a common SSRI side effect. ?No other associated symptoms, so we will hold on labs today. ?Monitor for new symptoms. ? ?Anxiety disorder ?He feels like the medication has helped some, he would like to try a higher dose. ?Because causing some drowsiness during the day, recommend taking medication at bedtime. ?Continue on Valium 5 mg daily as needed. ?We discussed some side effects of medications. ?Follow-up in 3 months, before if needed. ? ?Dystonia, unspecified ?He feels like Valium 5 mg helped some with symptoms. ?We discussed some side effects. ?No changes in Valium dose. ?Continue following with neurologist and ENT. ?He has an appointment with PT and planning on arranging appt with "myofacial release" provider as recommended by speech therapist. ? ?Return in about 3 months (around 03/11/2022). ? ?Sean Caughlin G. [place], MD ? ?[facility]. ?[place] office. ? ?

## 2021-12-09 ENCOUNTER — Encounter: Payer: Self-pay | Admitting: Family Medicine

## 2021-12-09 ENCOUNTER — Ambulatory Visit: Payer: BC Managed Care – PPO | Admitting: Family Medicine

## 2021-12-09 VITALS — BP 128/74 | HR 65 | Resp 16 | Ht 70.0 in | Wt 206.1 lb

## 2021-12-09 DIAGNOSIS — N401 Enlarged prostate with lower urinary tract symptoms: Secondary | ICD-10-CM

## 2021-12-09 DIAGNOSIS — F419 Anxiety disorder, unspecified: Secondary | ICD-10-CM | POA: Diagnosis not present

## 2021-12-09 DIAGNOSIS — G249 Dystonia, unspecified: Secondary | ICD-10-CM

## 2021-12-09 DIAGNOSIS — R351 Nocturia: Secondary | ICD-10-CM

## 2021-12-09 MED ORDER — SERTRALINE HCL 50 MG PO TABS: 50.0000 mg | ORAL_TABLET | Freq: Every day | ORAL | 1 refills | Status: DC

## 2021-12-09 MED ORDER — DIAZEPAM 5 MG PO TABS: 5.0000 mg | ORAL_TABLET | Freq: Every day | ORAL | 2 refills | Status: DC | PRN

## 2021-12-09 NOTE — Patient Instructions (Signed)
A few things to remember from today's visit: ? ?Dystonia, unspecified ? ?Anxiety disorder, unspecified type ? ?If you need refills please call your pharmacy. ?Do not use My Chart to request refills or for acute issues that need immediate attention. ?  ?Today we increased dose of Sertraline from 25 mg to 50 mg. Take medication at night. ?No changes in valium dose. ? ?Please be sure medication list is accurate. ?If a new problem present, please set up appointment sooner than planned today. ? ? ? ? ? ? ? ?

## 2021-12-09 NOTE — Assessment & Plan Note (Signed)
He feels like the medication has helped some, he would like to try a higher dose. ?Because causing some drowsiness during the day, recommend taking medication at bedtime. ?Continue on Valium 5 mg daily as needed. ?We discussed some side effects of medications. ?Follow-up in 3 months, before if needed. ?

## 2021-12-09 NOTE — Assessment & Plan Note (Addendum)
He feels like Valium 5 mg helped some with symptoms. ?We discussed some side effects. ?No changes in Valium dose. ?Continue following with neurologist and ENT. ?He has an appointment with PT and planning on arranging appt with "myofacial release" provider as recommended by speech therapist. ?

## 2021-12-21 DIAGNOSIS — N400 Enlarged prostate without lower urinary tract symptoms: Secondary | ICD-10-CM | POA: Diagnosis not present

## 2021-12-21 DIAGNOSIS — G244 Idiopathic orofacial dystonia: Secondary | ICD-10-CM | POA: Diagnosis not present

## 2021-12-21 DIAGNOSIS — G245 Blepharospasm: Secondary | ICD-10-CM | POA: Diagnosis not present

## 2021-12-21 DIAGNOSIS — Z880 Allergy status to penicillin: Secondary | ICD-10-CM | POA: Diagnosis not present

## 2021-12-21 DIAGNOSIS — Z79899 Other long term (current) drug therapy: Secondary | ICD-10-CM | POA: Diagnosis not present

## 2021-12-21 DIAGNOSIS — G249 Dystonia, unspecified: Secondary | ICD-10-CM | POA: Diagnosis not present

## 2021-12-21 DIAGNOSIS — G243 Spasmodic torticollis: Secondary | ICD-10-CM | POA: Diagnosis not present

## 2021-12-22 DIAGNOSIS — G244 Idiopathic orofacial dystonia: Secondary | ICD-10-CM | POA: Diagnosis not present

## 2021-12-22 DIAGNOSIS — S134XXD Sprain of ligaments of cervical spine, subsequent encounter: Secondary | ICD-10-CM | POA: Diagnosis not present

## 2021-12-30 DIAGNOSIS — S134XXD Sprain of ligaments of cervical spine, subsequent encounter: Secondary | ICD-10-CM | POA: Diagnosis not present

## 2021-12-30 DIAGNOSIS — G244 Idiopathic orofacial dystonia: Secondary | ICD-10-CM | POA: Diagnosis not present

## 2022-01-04 DIAGNOSIS — S134XXD Sprain of ligaments of cervical spine, subsequent encounter: Secondary | ICD-10-CM | POA: Diagnosis not present

## 2022-01-04 DIAGNOSIS — G244 Idiopathic orofacial dystonia: Secondary | ICD-10-CM | POA: Diagnosis not present

## 2022-01-17 DIAGNOSIS — S134XXD Sprain of ligaments of cervical spine, subsequent encounter: Secondary | ICD-10-CM | POA: Diagnosis not present

## 2022-01-17 DIAGNOSIS — G244 Idiopathic orofacial dystonia: Secondary | ICD-10-CM | POA: Diagnosis not present

## 2022-01-18 DIAGNOSIS — S134XXD Sprain of ligaments of cervical spine, subsequent encounter: Secondary | ICD-10-CM | POA: Diagnosis not present

## 2022-01-18 DIAGNOSIS — G244 Idiopathic orofacial dystonia: Secondary | ICD-10-CM | POA: Diagnosis not present

## 2022-01-24 DIAGNOSIS — G244 Idiopathic orofacial dystonia: Secondary | ICD-10-CM | POA: Diagnosis not present

## 2022-01-24 DIAGNOSIS — S134XXD Sprain of ligaments of cervical spine, subsequent encounter: Secondary | ICD-10-CM | POA: Diagnosis not present

## 2022-02-20 DIAGNOSIS — S134XXD Sprain of ligaments of cervical spine, subsequent encounter: Secondary | ICD-10-CM | POA: Diagnosis not present

## 2022-02-20 DIAGNOSIS — G244 Idiopathic orofacial dystonia: Secondary | ICD-10-CM | POA: Diagnosis not present

## 2022-02-27 DIAGNOSIS — S134XXD Sprain of ligaments of cervical spine, subsequent encounter: Secondary | ICD-10-CM | POA: Diagnosis not present

## 2022-02-27 DIAGNOSIS — G244 Idiopathic orofacial dystonia: Secondary | ICD-10-CM | POA: Diagnosis not present

## 2022-03-12 NOTE — Progress Notes (Signed)
HPI: [name] is a 51 y.o. male, who is here today for follow up.  Last seen on 12/09/21. Dystonia: Since his last visit he has seen his ENT and started PT, dry needling weekly; which has helped. He is still getting Botox injections q 2-3 months.  Anxiety:He is on Valium 5 mg daily as needed and Sertraline 50 mg daily. Sertraline is still causing drowsiness 1-2 hours after taking it. He has not rried taking medication at bedtime/ He feels like Sertraline is helping with anxiety. Negative for depression like symptoms. Valium also helps with facial involuntary movements, which seems to be aggravated by stress.  -Today he is complaining about intermittent urine leakage for the past 2-3 weeks.. In the past he has reported urinary frequency, no changes. Negative for dysuria, gross hematuria, decreased urine output, or forming urine.  -A week ago he had "funny little something" from left groin to left flank, resolved. Negative for masses or skin changes. Not sure about exacerbating or alleviating factors. No history of difficulty TSH. Negative for changes in bowel habits, nausea, or vomiting. BPH on Flomax 0.4 milligrams daily.  Lab Results  Component Value Date   PSA 1.24 08/29/2021   PSA 2.00 08/12/2020   PSA 1.21 08/20/2018   Lab Results  Component Value Date   CREATININE 0.93 08/29/2021   BUN 12 08/29/2021   NA 139 08/29/2021   K 4.2 08/29/2021   CL 104 08/29/2021   CO2 29 08/29/2021   -He is also concerned about "knot" he noted 5 days ago left preauricular.Tender, not sure about skin changes because it is covered by hair (beard). It has decreased in size and tenderness has improved. Negative for associated fever, sore throat, ear ache, or changes in hearing. No history of trauma. He has not tried OTC medications.  Review of Systems  Constitutional:  Negative for activity change, appetite change and fever.  HENT:  Negative for mouth sores, nosebleeds and trouble  swallowing.   Respiratory:  Negative for cough, shortness of breath and wheezing.   Cardiovascular:  Negative for chest pain and palpitations.  Gastrointestinal:  Negative for abdominal pain and blood in stool.  Endocrine: Negative for cold intolerance and heat intolerance.  Neurological:  Negative for syncope, weakness and headaches.  Psychiatric/Behavioral:  Negative for confusion and hallucinations.   Rest of ROS see pertinent positives and negatives in HPI.  Current Outpatient Medications on File Prior to Visit  Medication Sig Dispense Refill   diazepam (VALIUM) 5 MG tablet Take 1 tablet (5 mg total) by mouth daily as needed for anxiety (No more than 15 tabs per month.). 15 tablet 2   tamsulosin (FLOMAX) 0.4 MG CAPS capsule TAKE 1 CAPSULE(0.4 MG) BY MOUTH DAILY 90 capsule 2   trihexyphenidyl (ARTANE) 2 MG tablet Take 2 mg by mouth. 3 times daily with meals     No current facility-administered medications on file prior to visit.   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-  hard to chew at times- has spasms- get [facility] inj from [facility] for this    Hypertension    pt denies   Sleep apnea    treated x 90 days now off Cpap    Allergies  Allergen Reactions   Penicillins Rash    Social History   Socioeconomic History  Marital status: Single    Spouse name: Not on file   Number of children: Not on file   Years of education: Not on file   Highest education level: Not on file  Occupational History   Not on file  Tobacco Use   Smoking status: Never   Smokeless tobacco: Never  Vaping Use   Vaping Use: Never used  Substance and Sexual Activity   Alcohol use: Yes    Comment: 1 time a week   Drug use: No   Sexual activity: Not on file  Other Topics Concern   Not on file  Social History Narrative   Not on file   Social Determinants of Health   Financial Resource  Strain: Not on file  Food Insecurity: Not on file  Transportation Needs: Not on file  Physical Activity: Not on file  Stress: Not on file  Social Connections: Not on file   Vitals:   03/13/22 0736  BP: 128/80  Pulse: 80  Resp: 16  SpO2: 99%   Wt Readings from Last 3 Encounters:  03/13/22 204 lb 6 oz (92.7 kg)  12/09/21 206 lb 2 oz (93.5 kg)  08/29/21 208 lb 4 oz (94.5 kg)  Body mass index is 29.32 kg/m.  Physical Exam Vitals and nursing note reviewed.  Constitutional:      General: He is not in acute distress.    Appearance: He is well-developed and well-groomed.  HENT:     Head: Normocephalic and atraumatic.      Mouth/Throat:     Mouth: Mucous membranes are moist.     Pharynx: Oropharynx is clear.  Eyes:     Conjunctiva/sclera: Conjunctivae normal.  Cardiovascular:     Rate and Rhythm: Normal rate and regular rhythm.     Heart sounds: No murmur heard. Pulmonary:     Effort: Pulmonary effort is normal. No respiratory distress.     Breath sounds: Normal breath sounds.  Abdominal:     Palpations: Abdomen is soft. There is no hepatomegaly or mass.     Tenderness: There is no abdominal tenderness.  Musculoskeletal:     Right lower leg: No edema.     Left lower leg: No edema.  Skin:    General: Skin is warm.     Findings: No erythema.  Neurological:     Mental Status: He is alert and oriented to person, place, and time.     Comments: Repetitive mandibular movement when talking,improves with rest.   ASSESSMENT AND PLAN:  Sean Romero was seen today for follow-up.  Orders Placed This Encounter  Procedures   Urinalysis with Culture Reflex   Ambulatory referral to Urology   Skin lesion of face Not noted with inspection but with palpation. Improving. Hx and examination do not suggest a serious process. ? Cystic acne among other benign skin lesions. I do not think further workup is at this time, continue monitoring for changes.  Urinary incontinence,  unspecified type Could be caused by BPH. We discussed differential Dx's. UA obtained today. Urology referral placed.  Dystonia, unspecified PT and dry needling seem to be helping. Continue Valium 5 mg daily as needed. Following with ENT.  Anxiety disorder Sertraline has helped but causing drowsiness. We discussed options, which include changing medication to bedtime or trying a different SSRI, he preferred the latter one. Discontinue sertraline 50 mg. Today he will start Celexa 20 mg daily. He will let me know if he has any side effects. No changes in Valium dose.  Return in about 6 months (around 08/30/2022) for cpe.  Zyon Rosser G. [place], MD  [facility]. [place] office.

## 2022-03-13 ENCOUNTER — Encounter: Payer: Self-pay | Admitting: Family Medicine

## 2022-03-13 ENCOUNTER — Ambulatory Visit: Payer: BC Managed Care – PPO | Admitting: Family Medicine

## 2022-03-13 VITALS — BP 128/80 | HR 80 | Resp 16 | Ht 70.0 in | Wt 204.4 lb

## 2022-03-13 DIAGNOSIS — R32 Unspecified urinary incontinence: Secondary | ICD-10-CM | POA: Diagnosis not present

## 2022-03-13 DIAGNOSIS — F419 Anxiety disorder, unspecified: Secondary | ICD-10-CM

## 2022-03-13 DIAGNOSIS — G249 Dystonia, unspecified: Secondary | ICD-10-CM

## 2022-03-13 DIAGNOSIS — L989 Disorder of the skin and subcutaneous tissue, unspecified: Secondary | ICD-10-CM

## 2022-03-13 MED ORDER — CITALOPRAM HYDROBROMIDE 20 MG PO TABS: 20.0000 mg | ORAL_TABLET | Freq: Every day | ORAL | 3 refills | Status: DC

## 2022-03-13 NOTE — Assessment & Plan Note (Signed)
PT and dry needling seem to be helping. Continue Valium 5 mg daily as needed. Following with ENT.

## 2022-03-13 NOTE — Patient Instructions (Addendum)
A few things to remember from today's visit:  Dystonia, unspecified  Anxiety disorder, unspecified type - Plan: citalopram (CELEXA) 20 MG tablet  Skin lesion of face  Urinary incontinence, unspecified type - Plan: Ambulatory referral to Urology, Urinalysis with Culture Reflex  If you need refills please call your pharmacy. Do not use My Chart to request refills or for acute issues that need immediate attention.   Skin lesion seems benign, continue monitoring for changes. Appt with urologist will be arranged. Stop Sertraline. Start Celexa 20 mg daily.Let me know if you have any side effect. No changes in Valium.  I will see you back in 5 months, we can do fasting labs then.  Please be sure medication list is accurate. If a new problem present, please set up appointment sooner than planned today.

## 2022-03-13 NOTE — Assessment & Plan Note (Signed)
Sertraline has helped but causing drowsiness. We discussed options, which include changing medication to bedtime or trying a different SSRI, he preferred the latter one. Discontinue sertraline 50 mg. Today he will start Celexa 20 mg daily. He will let me know if he has any side effects. No changes in Valium dose.

## 2022-03-15 LAB — URINALYSIS W MICROSCOPIC + REFLEX CULTURE
Bilirubin Urine: NEGATIVE
Glucose, UA: NEGATIVE
Ketones, ur: NEGATIVE
Nitrites, Initial: POSITIVE — AB
Specific Gravity, Urine: 1.02 (ref 1.001–1.035)
WBC, UA: >=60 /HPF — AB (ref 0–5)
pH: 6 (ref 5.0–8.0)

## 2022-03-15 LAB — URINE CULTURE
MICRO NUMBER:: 13749283
SPECIMEN QUALITY:: ADEQUATE

## 2022-03-15 LAB — CULTURE INDICATED

## 2022-03-17 DIAGNOSIS — S134XXD Sprain of ligaments of cervical spine, subsequent encounter: Secondary | ICD-10-CM | POA: Diagnosis not present

## 2022-03-17 DIAGNOSIS — G244 Idiopathic orofacial dystonia: Secondary | ICD-10-CM | POA: Diagnosis not present

## 2022-03-20 ENCOUNTER — Other Ambulatory Visit: Payer: Self-pay

## 2022-03-20 DIAGNOSIS — R350 Frequency of micturition: Secondary | ICD-10-CM

## 2022-03-20 MED ORDER — SULFAMETHOXAZOLE-TRIMETHOPRIM 800-160 MG PO TABS: 1.0000 | ORAL_TABLET | Freq: Two times a day (BID) | ORAL | 0 refills | Status: AC

## 2022-03-30 DIAGNOSIS — S134XXD Sprain of ligaments of cervical spine, subsequent encounter: Secondary | ICD-10-CM | POA: Diagnosis not present

## 2022-03-30 DIAGNOSIS — G244 Idiopathic orofacial dystonia: Secondary | ICD-10-CM | POA: Diagnosis not present

## 2022-04-06 DIAGNOSIS — S134XXD Sprain of ligaments of cervical spine, subsequent encounter: Secondary | ICD-10-CM | POA: Diagnosis not present

## 2022-04-06 DIAGNOSIS — G244 Idiopathic orofacial dystonia: Secondary | ICD-10-CM | POA: Diagnosis not present

## 2022-04-20 DIAGNOSIS — G244 Idiopathic orofacial dystonia: Secondary | ICD-10-CM | POA: Diagnosis not present

## 2022-04-20 DIAGNOSIS — S134XXD Sprain of ligaments of cervical spine, subsequent encounter: Secondary | ICD-10-CM | POA: Diagnosis not present

## 2022-04-27 DIAGNOSIS — G244 Idiopathic orofacial dystonia: Secondary | ICD-10-CM | POA: Diagnosis not present

## 2022-04-27 DIAGNOSIS — S134XXD Sprain of ligaments of cervical spine, subsequent encounter: Secondary | ICD-10-CM | POA: Diagnosis not present

## 2022-05-02 DIAGNOSIS — G244 Idiopathic orofacial dystonia: Secondary | ICD-10-CM | POA: Diagnosis not present

## 2022-05-02 DIAGNOSIS — S134XXD Sprain of ligaments of cervical spine, subsequent encounter: Secondary | ICD-10-CM | POA: Diagnosis not present

## 2022-05-08 DIAGNOSIS — S134XXD Sprain of ligaments of cervical spine, subsequent encounter: Secondary | ICD-10-CM | POA: Diagnosis not present

## 2022-05-08 DIAGNOSIS — G244 Idiopathic orofacial dystonia: Secondary | ICD-10-CM | POA: Diagnosis not present

## 2022-05-08 NOTE — Progress Notes (Unsigned)
ACUTE VISIT Chief Complaint  Patient presents with   Urinary Incontinence   HPI: Sean Romero is a 51 y.o. male with medical hx significant for prediabetes,HLD,facial dystonia, and anxiety here today complaining of worsening urinary incontinence. He was seen on 03/13/22, when he was c/o intermittent urinary leakage for about 3 weeks. Because abnormal UA,empiric treatment with bactrim was started. Urine incontinence happens about 3 times per week. Saturday he had episode of nocturnal enuresis, woke up on a "big paddle" of urine.  He has had urinary frequency for a while, he is not taking Flomax 0.4 mg. Negative for fever,chills,abdominal pain,gross hematuria,dysuria, or difficulty starting urination. He does not feel like he is emptying his bladder.  C/O right-sided lower back pain, not radiated. Negative for saddle anesthesia, LE numbness/tingling,or weakness.  Review of Systems  Constitutional:  Negative for activity change and appetite change.  Respiratory:  Negative for cough and wheezing.   Cardiovascular:  Negative for leg swelling.  Gastrointestinal:  Negative for abdominal pain, nausea and vomiting.       No changes in bowel habits.  Endocrine: Negative for polydipsia, polyphagia and polyuria.  Genitourinary:  Negative for flank pain.  Skin:  Negative for rash.  Neurological:  Negative for seizures, syncope, weakness and headaches.  Rest see pertinent positives and negatives per HPI.  Current Outpatient Medications on File Prior to Visit  Medication Sig Dispense Refill   citalopram (CELEXA) 20 MG tablet Take 1 tablet (20 mg total) by mouth daily. 30 tablet 3   diazepam (VALIUM) 5 MG tablet Take 1 tablet (5 mg total) by mouth daily as needed for anxiety (No more than 15 tabs per month.). 15 tablet 2   tamsulosin (FLOMAX) 0.4 MG CAPS capsule TAKE 1 CAPSULE(0.4 MG) BY MOUTH DAILY 90 capsule 2   trihexyphenidyl (ARTANE) 2 MG tablet Take 2 mg by mouth. 3 times daily with  meals     No current facility-administered medications on file prior to visit.   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-  hard to chew at times- has spasms- [facility] for this    Hypertension    pt denies   Sleep apnea    treated x 90 days now off Cpap    Allergies  Allergen Reactions   Penicillins Rash   Social History   Socioeconomic History   Marital status: Single    Spouse name: Not on file   Number of children: Not on file   Years of education: Not on file   Highest education level: Not on file  Occupational History   Not on file  Tobacco Use   Smoking status: Never   Smokeless tobacco: Never  Vaping Use   Vaping Use: Never used  Substance and Sexual Activity   Alcohol use: Yes    Comment: 1 time a week   Drug use: No   Sexual activity: Not on file  Other Topics Concern   Not on file  Social History Narrative   Not on file   Social Determinants of Health   Financial Resource Strain: Not on file  Food Insecurity: Not on file  Transportation Needs: Not on file  Physical Activity: Not on file  Stress: Not on file  Social Connections: Not on file   Vitals:  05/09/22 1603  BP: 120/70  Pulse: 94  Resp: 12  SpO2: 97%  Body mass index is 29.31 kg/m.  Physical Exam Vitals and nursing note reviewed.  Constitutional:      General: He is not in acute distress.    Appearance: He is well-developed.  HENT:     Head: Normocephalic and atraumatic.  Eyes:     Conjunctiva/sclera: Conjunctivae normal.  Cardiovascular:     Rate and Rhythm: Normal rate and regular rhythm.     Heart sounds: No murmur heard. Pulmonary:     Effort: Pulmonary effort is normal. No respiratory distress.     Breath sounds: Normal breath sounds.  Abdominal:     Palpations: Abdomen is soft. There is no hepatomegaly or mass.     Tenderness: There is no  abdominal tenderness.  Musculoskeletal:       Back:     Comments: Pain is not elicited with movement on exam table during examination.  Skin:    General: Skin is warm.     Findings: No erythema.  Neurological:     Mental Status: He is alert and oriented to person, place, and time.     Gait: Gait normal.  Psychiatric:        Mood and Affect: Affect normal. Mood is anxious.   ASSESSMENT AND PLAN:  Sean Romero was seen today for urinary incontinence.  Diagnoses and all orders for this visit:  Urinary incontinence, unspecified type Problem seems to be getting worse. Today we are looking into urology referral and he will receive a call with information. I also gave him the contact information of Dr McDiarmid.  BPH associated with nocturia We discussed Dx and treatment options. He will start taking Flomax 0.4 mg daily at bedtime.  Chronic right-sided low back pain without sciatica I do not think this problem is causing urinary incontinence. If back pain gets worse or radicular like symptoms present, we can consider imaging. Topical icy hot and Tylenol 500 mg 3-4 times per day if needed.  Return if symptoms worsen or fail to improve, for Keep next appt.  Sean Corvin G. [place], MD  [facility] [place] office.

## 2022-05-09 ENCOUNTER — Encounter: Payer: Self-pay | Admitting: Family Medicine

## 2022-05-09 ENCOUNTER — Ambulatory Visit (INDEPENDENT_AMBULATORY_CARE_PROVIDER_SITE_OTHER): Payer: BC Managed Care – PPO | Admitting: Family Medicine

## 2022-05-09 VITALS — BP 120/70 | HR 94 | Resp 12 | Ht 70.0 in | Wt 204.2 lb

## 2022-05-09 DIAGNOSIS — R32 Unspecified urinary incontinence: Secondary | ICD-10-CM | POA: Diagnosis not present

## 2022-05-09 DIAGNOSIS — M545 Low back pain, unspecified: Secondary | ICD-10-CM | POA: Diagnosis not present

## 2022-05-09 DIAGNOSIS — R351 Nocturia: Secondary | ICD-10-CM | POA: Diagnosis not present

## 2022-05-09 DIAGNOSIS — N401 Enlarged prostate with lower urinary tract symptoms: Secondary | ICD-10-CM | POA: Diagnosis not present

## 2022-05-09 DIAGNOSIS — G8929 Other chronic pain: Secondary | ICD-10-CM

## 2022-05-09 NOTE — Patient Instructions (Addendum)
A few things to remember from today's visit:   BPH associated with nocturia  Urinary incontinence, unspecified type  Chronic right-sided low back pain without sciatica  [facility]: Mac Diarmid Elayne Snare MD Address: [address] [place], [place], [place] [address] Phone: [phone]  Start Flomax daily at bedtime. I do not think back pain is causing the urine incontinence. It seems musculoskeletal. Blood in urine was very mild, urologist most likely will re-check it.  If you need refills for medications you take chronically, please call your pharmacy. Do not use My Chart to request refills or for acute issues that need immediate attention. If you send a my chart message, it may take a few days to be addressed, specially if I am not in the office.  Please be sure medication list is accurate. If a new problem present, please set up appointment sooner than planned today.

## 2022-05-11 ENCOUNTER — Encounter: Payer: Self-pay | Admitting: Family Medicine

## 2022-05-15 DIAGNOSIS — S134XXD Sprain of ligaments of cervical spine, subsequent encounter: Secondary | ICD-10-CM | POA: Diagnosis not present

## 2022-05-15 DIAGNOSIS — G244 Idiopathic orofacial dystonia: Secondary | ICD-10-CM | POA: Diagnosis not present

## 2022-05-23 DIAGNOSIS — R3914 Feeling of incomplete bladder emptying: Secondary | ICD-10-CM | POA: Diagnosis not present

## 2022-05-23 DIAGNOSIS — R3121 Asymptomatic microscopic hematuria: Secondary | ICD-10-CM | POA: Diagnosis not present

## 2022-05-23 DIAGNOSIS — R8271 Bacteriuria: Secondary | ICD-10-CM | POA: Diagnosis not present

## 2022-05-23 DIAGNOSIS — N3944 Nocturnal enuresis: Secondary | ICD-10-CM | POA: Diagnosis not present

## 2022-05-23 DIAGNOSIS — N3941 Urge incontinence: Secondary | ICD-10-CM | POA: Diagnosis not present

## 2022-05-24 DIAGNOSIS — S134XXD Sprain of ligaments of cervical spine, subsequent encounter: Secondary | ICD-10-CM | POA: Diagnosis not present

## 2022-05-24 DIAGNOSIS — G244 Idiopathic orofacial dystonia: Secondary | ICD-10-CM | POA: Diagnosis not present

## 2022-05-29 DIAGNOSIS — G244 Idiopathic orofacial dystonia: Secondary | ICD-10-CM | POA: Diagnosis not present

## 2022-05-29 DIAGNOSIS — S134XXD Sprain of ligaments of cervical spine, subsequent encounter: Secondary | ICD-10-CM | POA: Diagnosis not present

## 2022-06-01 ENCOUNTER — Emergency Department (HOSPITAL_BASED_OUTPATIENT_CLINIC_OR_DEPARTMENT_OTHER)
Admission: EM | Admit: 2022-06-01 | Discharge: 2022-06-01 | Disposition: A | Discharge status: Home / Self Care | Payer: BC Managed Care – PPO | Attending: Emergency Medicine | Admitting: Emergency Medicine

## 2022-06-01 ENCOUNTER — Emergency Department (HOSPITAL_BASED_OUTPATIENT_CLINIC_OR_DEPARTMENT_OTHER): Payer: BC Managed Care – PPO | Admitting: Radiology

## 2022-06-01 ENCOUNTER — Other Ambulatory Visit: Payer: Self-pay

## 2022-06-01 ENCOUNTER — Encounter (HOSPITAL_BASED_OUTPATIENT_CLINIC_OR_DEPARTMENT_OTHER): Payer: Self-pay | Admitting: Emergency Medicine

## 2022-06-01 DIAGNOSIS — R059 Cough, unspecified: Secondary | ICD-10-CM | POA: Diagnosis not present

## 2022-06-01 DIAGNOSIS — I10 Essential (primary) hypertension: Secondary | ICD-10-CM | POA: Diagnosis not present

## 2022-06-01 DIAGNOSIS — J069 Acute upper respiratory infection, unspecified: Secondary | ICD-10-CM | POA: Diagnosis not present

## 2022-06-01 DIAGNOSIS — R531 Weakness: Secondary | ICD-10-CM | POA: Diagnosis not present

## 2022-06-01 DIAGNOSIS — Z20822 Contact with and (suspected) exposure to covid-19: Secondary | ICD-10-CM | POA: Diagnosis not present

## 2022-06-01 DIAGNOSIS — R079 Chest pain, unspecified: Secondary | ICD-10-CM | POA: Diagnosis not present

## 2022-06-01 LAB — D-DIMER, QUANTITATIVE: D-Dimer, Quant: <0.27 ug/mL-FEU (ref 0.00–0.50)

## 2022-06-01 LAB — BASIC METABOLIC PANEL
Anion gap: 6 (ref 5–15)
BUN: 11 mg/dL (ref 6–20)
CO2: 30 mmol/L (ref 22–32)
Calcium: 9.3 mg/dL (ref 8.9–10.3)
Chloride: 103 mmol/L (ref 98–111)
Creatinine, Ser: 1.02 mg/dL (ref 0.61–1.24)
GFR, Estimated: >60 mL/min (ref >60)
Glucose, Bld: 98 mg/dL (ref 70–99)
Potassium: 3.8 mmol/L (ref 3.5–5.1)
Sodium: 139 mmol/L (ref 135–145)

## 2022-06-01 LAB — RESP PANEL BY RT-PCR (FLU A&B, COVID) ARPGX2
Influenza A by PCR: NEGATIVE
Influenza B by PCR: NEGATIVE
SARS Coronavirus 2 by RT PCR: NEGATIVE

## 2022-06-01 MED ORDER — IBUPROFEN 400 MG PO TABS
600.0000 mg | ORAL_TABLET | Freq: Once | ORAL | Status: AC
  Administered 2022-06-01: 600 mg via ORAL
  Filled 2022-06-01: qty 1

## 2022-06-01 NOTE — Discharge Instructions (Addendum)
Note the work-up today was overall reassuring.  No evidence at this time for pulmonary embolism.  Respiratory viral panel was negative for influenza and COVID.  Recommend symptomatic therapy at home with Tylenol/ibuprofen as needed for pain.  Recommend follow-up with your PCP in 3 to 5 days for reevaluation.  Please not hesitate to return to emergency department for worrisome signs and symptoms we discussed, parent.

## 2022-06-01 NOTE — ED Triage Notes (Signed)
Pt arrives to ED with c/o cough, chills, sore throat, ribcage pain that started yesterday. Ribcage pain when he coughs.

## 2022-06-01 NOTE — ED Notes (Signed)
EKG completed. Trouble with exporting EKG in room and monitor. Print out given to provider.

## 2022-06-01 NOTE — ED Provider Notes (Signed)
[facility] EMERGENCY DEPT Provider Note   CSN: [id number] Arrival date & time: 06/01/22  1344     History  Chief Complaint  Patient presents with   Cough    Sean Romero is a 51 y.o. male.   Cough   51 year old male presents emergency department with complaints of cough, chills, chest pain.  Patient states that Sean Romero recently traveled to [place] via plane.  Sean Romero reports that since Sean Romero has been back, Sean Romero has noticed chest pain that is slowly worsened with taking deep breath as well as coughing.  Sean Romero notes no associated shortness of breath.  Sean Romero reports diffuse upper extremity myalgias as well as subjective chills.  Denies history of DVT/PE.  Denies fever, abdominal pain, nausea, vomiting, urinary symptoms, change in bowel habits.  Past medical history significant for arthritis, allergy, hypertension, OSA  Home Medications Prior to Admission medications   Medication Sig Start Date End Date Taking? Authorizing Provider  citalopram (CELEXA) 20 MG tablet Take 1 tablet (20 mg total) by mouth daily. 03/13/22   [place], Betty G, MD  diazepam (VALIUM) 5 MG tablet Take 1 tablet (5 mg total) by mouth daily as needed for anxiety (No more than 15 tabs per month.). 12/09/21   [place], Betty G, MD  tamsulosin (FLOMAX) 0.4 MG CAPS capsule TAKE 1 CAPSULE(0.4 MG) BY MOUTH DAILY 08/18/21   [place], Betty G, MD  trihexyphenidyl (ARTANE) 2 MG tablet Take 2 mg by mouth. 3 times daily with meals    [provider]      Allergies    Penicillins    Review of Systems   Review of Systems  Respiratory:  Positive for cough.   All other systems reviewed and are negative.   Physical Exam Updated Vital Signs BP 126/86   Pulse 77   Temp 98.3 F (36.8 C)   Resp 16   Ht 5\' 9"  (1.753 m)   Wt 90.7 kg   SpO2 99%   BMI 29.53 kg/m  Physical Exam Vitals and nursing note reviewed.  Constitutional:      General: Sean Romero is not in acute distress.    Appearance: Sean Romero is well-developed.  HENT:      Head: Normocephalic and atraumatic.  Eyes:     Conjunctiva/sclera: Conjunctivae normal.  Cardiovascular:     Rate and Rhythm: Normal rate and regular rhythm.     Heart sounds: No murmur heard. Pulmonary:     Effort: Pulmonary effort is normal. No respiratory distress.     Breath sounds: Normal breath sounds.  Abdominal:     Palpations: Abdomen is soft.     Tenderness: There is no abdominal tenderness.  Musculoskeletal:        General: No swelling.     Cervical back: Neck supple.     Comments: 0-1+ bilateral lower extremity edema noted.  Skin:    General: Skin is warm and dry.     Capillary Refill: Capillary refill takes less than 2 seconds.  Neurological:     Mental Status: Sean Romero is alert.  Psychiatric:        Mood and Affect: Mood normal.     ED Results / Procedures / Treatments   Labs (all labs ordered are listed, but only abnormal results are displayed) Labs Reviewed  RESP PANEL BY RT-PCR (FLU A&B, COVID) ARPGX2  D-DIMER, QUANTITATIVE  BASIC METABOLIC PANEL    EKG None  Radiology DG Chest 2 View  Result Date: 06/01/2022 CLINICAL DATA:  Cough and ribcage pain,  weakness EXAM: CHEST - 2 VIEW COMPARISON:  Chest radiograph 05/28/2018 FINDINGS: The cardiomediastinal silhouette is normal There is no focal consolidation or pulmonary edema. There is no pleural effusion or pneumothorax There is no acute osseous abnormality. IMPRESSION: No radiographic evidence of acute cardiopulmonary process. Electronically Signed   By: Lesia Hausen M.D.   On: 06/01/2022 14:37    Procedures Procedures    Medications Ordered in ED Medications  ibuprofen (ADVIL) tablet 600 mg (600 mg Oral Given 06/01/22 1657)    ED Course/ Medical Decision Making/ A&P                           Medical Decision Making Amount and/or Complexity of Data Reviewed Labs: ordered. Radiology: ordered.   This patient presents to the ED for concern of cough, sore throat, pleuritic chest pain, this  involves an extensive number of treatment options, and is a complaint that carries with it a high risk of complications and morbidity.  The differential diagnosis includes PE, influenza, COVID, meningitis, URI   Co morbidities that complicate the patient evaluation  See HPI   Additional history obtained:  Additional history obtained from EMR External records from outside source obtained and reviewed including hospital records   Lab Tests:  I Ordered, and personally interpreted labs.  The pertinent results include: D-dimer 0.27.  Respiratory viral panel negative.  No electrolyte abnormalities.  Renal function within normal limits.   Imaging Studies ordered:  I ordered a chest x-ray which was reviewed independently by me showed no acute cardiopulmonary abnormalities.  I agree with radiologist interpretation.  Cardiac Monitoring: / EKG:  The patient was maintained on a cardiac monitor.  I personally viewed and interpreted the cardiac monitored which showed an underlying rhythm of: Sinus rhythm with no acute ischemic changes.   Consultations Obtained:  N/a   Problem List / ED Course / Critical interventions / Medication management  URI I ordered medication including Advil for pain  Reevaluation of the patient after these medicines showed that the patient improved I have reviewed the patients home medicines and have made adjustments as needed   Social Determinants of Health:  Denies tobacco, illicit drug use   Test / Admission - Considered:  URI Vitals signs within normal range and stable throughout visit. Laboratory/imaging studies significant for: See above Patient symptoms likely secondary to viral URI.  Doubt PE given negative D-dimer although patient complaining of pleuritic chest pain.  Patient responded well to ibuprofen administered while in emergency department for pleuritic type chest pain.  Outpatient therapy recommended at the same.  Close follow-up with PCP  recommended 3 to 5 days.  Treatment plan discussed at length with patient and Sean Romero acknowledged understanding was agreeable to said plan. Worrisome signs and symptoms were discussed with the patient, and the patient acknowledged understanding to return to the ED if noticed. Patient was stable upon discharge.          Final Clinical Impression(s) / ED Diagnoses Final diagnoses:  Upper respiratory tract infection, unspecified type    Rx / DC Orders ED Discharge Orders     None         Peter Garter, [place] 06/01/22 1732    Elayne Snare K, DO 06/02/22 0001

## 2022-06-05 ENCOUNTER — Emergency Department (HOSPITAL_BASED_OUTPATIENT_CLINIC_OR_DEPARTMENT_OTHER): Payer: BC Managed Care – PPO | Admitting: Radiology

## 2022-06-05 ENCOUNTER — Encounter (HOSPITAL_BASED_OUTPATIENT_CLINIC_OR_DEPARTMENT_OTHER): Payer: Self-pay

## 2022-06-05 ENCOUNTER — Other Ambulatory Visit: Payer: Self-pay

## 2022-06-05 DIAGNOSIS — J189 Pneumonia, unspecified organism: Secondary | ICD-10-CM | POA: Diagnosis not present

## 2022-06-05 DIAGNOSIS — R059 Cough, unspecified: Secondary | ICD-10-CM | POA: Diagnosis not present

## 2022-06-05 DIAGNOSIS — R079 Chest pain, unspecified: Secondary | ICD-10-CM | POA: Diagnosis not present

## 2022-06-05 LAB — BASIC METABOLIC PANEL
Anion gap: 10 (ref 5–15)
BUN: 9 mg/dL (ref 6–20)
CO2: 24 mmol/L (ref 22–32)
Calcium: 9 mg/dL (ref 8.9–10.3)
Chloride: 102 mmol/L (ref 98–111)
Creatinine, Ser: 0.79 mg/dL (ref 0.61–1.24)
GFR, Estimated: >60 mL/min (ref >60)
Glucose, Bld: 108 mg/dL — ABNORMAL HIGH (ref 70–99)
Potassium: 3.5 mmol/L (ref 3.5–5.1)
Sodium: 136 mmol/L (ref 135–145)

## 2022-06-05 LAB — CBC
HCT: 40.1 % (ref 39.0–52.0)
Hemoglobin: 13.8 g/dL (ref 13.0–17.0)
MCH: 30.8 pg (ref 26.0–34.0)
MCHC: 34.4 g/dL (ref 30.0–36.0)
MCV: 89.5 fL (ref 80.0–100.0)
Platelets: 177 10*3/uL (ref 150–400)
RBC: 4.48 MIL/uL (ref 4.22–5.81)
RDW: 12.5 % (ref 11.5–15.5)
WBC: 5 10*3/uL (ref 4.0–10.5)
nRBC: 0 % (ref 0.0–0.2)

## 2022-06-05 LAB — TROPONIN I (HIGH SENSITIVITY): Troponin I (High Sensitivity): 4 ng/L (ref <18)

## 2022-06-05 NOTE — ED Triage Notes (Signed)
Pt reports cough, chest pain, and "rattling in his chest" since last week. Pt denies any fevers.

## 2022-06-06 ENCOUNTER — Emergency Department (HOSPITAL_BASED_OUTPATIENT_CLINIC_OR_DEPARTMENT_OTHER)
Admission: EM | Admit: 2022-06-06 | Discharge: 2022-06-06 | Disposition: A | Discharge status: Home / Self Care | Payer: BC Managed Care – PPO | Attending: Emergency Medicine | Admitting: Emergency Medicine

## 2022-06-06 DIAGNOSIS — J189 Pneumonia, unspecified organism: Secondary | ICD-10-CM

## 2022-06-06 MED ORDER — DOXYCYCLINE HYCLATE 100 MG PO CAPS: 100.0000 mg | ORAL_CAPSULE | Freq: Two times a day (BID) | ORAL | 0 refills | Status: DC

## 2022-06-06 MED ORDER — DOXYCYCLINE HYCLATE 100 MG PO TABS
100.0000 mg | ORAL_TABLET | Freq: Once | ORAL | Status: AC
  Administered 2022-06-06: 100 mg via ORAL
  Filled 2022-06-06: qty 1

## 2022-06-06 NOTE — ED Provider Notes (Signed)
[facility] EMERGENCY DEPT  Provider Note  CSN: [id number] Arrival date & time: 06/05/22 2248  History Chief Complaint  Patient presents with   Chest Pain   Cough    Sean Romero is a 50 y.o. male presents for re-evaluation of continued cough. He was seen in the ED 10/26 for cough and chest soreness. CXR as well as labs (including d-dimer) were negative then. He reports cough has been persistent, now associated with sputum and 'a rattle' in his chest. Chest soreness is not as bad now. Denies any fever. No SOB.    Home Medications Prior to Admission medications   Medication Sig Start Date End Date Taking? Authorizing Provider  doxycycline (VIBRAMYCIN) 100 MG capsule Take 1 capsule (100 mg total) by mouth 2 (two) times daily. 06/06/22  Yes Truddie Hidden, MD  citalopram (CELEXA) 20 MG tablet Take 1 tablet (20 mg total) by mouth daily. 03/13/22   [place], Betty G, MD  diazepam (VALIUM) 5 MG tablet Take 1 tablet (5 mg total) by mouth daily as needed for anxiety (No more than 15 tabs per month.). 12/09/21   [place], Betty G, MD  tamsulosin (FLOMAX) 0.4 MG CAPS capsule TAKE 1 CAPSULE(0.4 MG) BY MOUTH DAILY 08/18/21   [place], Betty G, MD  trihexyphenidyl (ARTANE) 2 MG tablet Take 2 mg by mouth. 3 times daily with meals    [provider]     Allergies    Penicillins   Review of Systems   Review of Systems Please see HPI for pertinent positives and negatives  Physical Exam BP 123/80   Pulse 87   Temp 97.8 F (36.6 C)   Resp 18   Ht 5\' 9"  (1.753 m)   Wt 91.6 kg   SpO2 98%   BMI 29.83 kg/m   Physical Exam Vitals and nursing note reviewed.  Constitutional:      Appearance: Normal appearance.  HENT:     Head: Normocephalic and atraumatic.     Nose: Nose normal.     Mouth/Throat:     Mouth: Mucous membranes are moist.  Eyes:     Extraocular Movements: Extraocular movements intact.     Conjunctiva/sclera: Conjunctivae normal.  Cardiovascular:      Rate and Rhythm: Normal rate.  Pulmonary:     Effort: Pulmonary effort is normal.     Breath sounds: Normal breath sounds.  Abdominal:     General: Abdomen is flat.     Palpations: Abdomen is soft.     Tenderness: There is no abdominal tenderness.  Musculoskeletal:        General: No swelling. Normal range of motion.     Cervical back: Neck supple.  Skin:    General: Skin is warm and dry.  Neurological:     General: No focal deficit present.     Mental Status: He is alert.  Psychiatric:        Mood and Affect: Mood normal.     ED Results / Procedures / Treatments   EKG None  Procedures Procedures  Medications Ordered in the ED Medications  doxycycline (VIBRA-TABS) tablet 100 mg (100 mg Oral Given 06/06/22 0221)    Initial Impression and Plan  Patient here with persistent cough. Vitals and exam are reassuring. Labs done in triage show normal CBC, BMP and Trop. Given duration of symptoms repeat trop not indicated. I personally viewed the images from radiology studies and agree with radiologist interpretation:  CXR concerning for developing infiltrates. He otherwise  looks well, does not require admission. Will begin doxycycline, recommend OTC cough medications. Advised his cough may persist even when infection is cleared. PCP follow up.   ED Course       MDM Rules/Calculators/A&P Medical Decision Making Given presenting complaint, I considered that admission might be necessary. After review of results from ED lab and/or imaging studies, admission to the hospital is not indicated at this time.    Amount and/or Complexity of Data Reviewed Labs: ordered. Decision-making details documented in ED Course. Radiology: ordered and independent interpretation performed. Decision-making details documented in ED Course. ECG/medicine tests: ordered and independent interpretation performed. Decision-making details documented in ED Course.  Risk Prescription drug  management. Decision regarding hospitalization.    Final Clinical Impression(s) / ED Diagnoses Final diagnoses:  Community acquired pneumonia, unspecified laterality    Rx / DC Orders ED Discharge Orders          Ordered    doxycycline (VIBRAMYCIN) 100 MG capsule  2 times daily        06/06/22 0221             Pollyann Savoy, MD 06/06/22 0222

## 2022-06-06 NOTE — ED Notes (Signed)
Pt verbalizes understanding of discharge instructions. Opportunity for questioning and answers were provided. Pt discharged from ED to home.   ? ?

## 2022-06-12 ENCOUNTER — Telehealth: Payer: Self-pay | Admitting: Licensed Clinical Social Worker

## 2022-06-13 NOTE — Patient Instructions (Signed)
Visit Information  Thank you for taking time to visit with me today. Please don't hesitate to contact me if I can be of assistance to you.   Following are the goals we discussed today:   Goals Addressed             This Visit's Progress    COMPLETED: Care Coordination Activities-No Follow Up required       Care Coordination Interventions: Solution-Focused Strategies employed:  Active listening / Reflection utilized  Emotional Support Provided Patient reports feeling "better now" Pt returned to work today. States that his employer encouraged him to have FMLA paperwork completed Patient continues to be compliant with antibiotics. Denies adverse side effects.  LCSW informed patient of care coordination services. Pt is not interested at this time and agreed to contact PCP, should needs arise 06/13/22: LCSW contacted [employer], to call and schedule ED FU appt, per pt request. Hinton Dyer encouraged LCSW to contact pt and have him call the office to schedule at his earliest convenience. LCSW informed pt, who was agreeable noting he will also bring FMLA ppwk to appt for PCP to complete         If you are experiencing a Mental Health or [place] or need someone to talk to, please call the Suicide and Crisis Lifeline: 988 call 911   Patient verbalizes understanding of instructions and care plan provided today and agrees to view in [place]. Active MyChart status and patient understanding of how to access instructions and care plan via MyChart confirmed with patient.     No further follow up required:    Christa See, MSW, [email or web address] Phone [phone] 8:27 AM

## 2022-06-13 NOTE — Patient Outreach (Signed)
  Care Coordination   Initial Visit Note   06/13/2022 Name: Sean Romero MRN: [id number] DOB: Jun 01, 1971  Sean Romero is a 51 y.o. year old male who sees [place], Malka So, MD for primary care. I spoke with  Sean Romero by phone today.  What matters to the patients health and wellness today?  Care Coordination    Goals Addressed             This Visit's Progress    COMPLETED: Care Coordination Activities-No Follow Up required       Care Coordination Interventions: Solution-Focused Strategies employed:  Active listening / Reflection utilized  Emotional Support Provided Patient reports feeling "better now" Pt returned to work today. States that his employer encouraged him to have FMLA paperwork completed Patient continues to be compliant with antibiotics. Denies adverse side effects.  LCSW informed patient of care coordination services. Pt is not interested at this time and agreed to contact PCP, should needs arise 06/13/22: LCSW contacted [employer], to call and schedule ED FU appt, per pt request. Sean Romero encouraged LCSW to contact pt and have him call the office to schedule at his earliest convenience. LCSW informed pt, who was agreeable noting he will also bring FMLA ppwk to appt for PCP to complete         SDOH assessments and interventions completed:  No     Care Coordination Interventions Activated:  Yes  Care Coordination Interventions:  Yes, provided   Follow up plan: No further intervention required.   Encounter Outcome:  Pt. Visit Completed   Christa See, MSW, [email or web address] Phone [phone] 8:25 AM

## 2022-06-21 DIAGNOSIS — S134XXD Sprain of ligaments of cervical spine, subsequent encounter: Secondary | ICD-10-CM | POA: Diagnosis not present

## 2022-06-21 DIAGNOSIS — G244 Idiopathic orofacial dystonia: Secondary | ICD-10-CM | POA: Diagnosis not present

## 2022-07-03 DIAGNOSIS — N3289 Other specified disorders of bladder: Secondary | ICD-10-CM | POA: Diagnosis not present

## 2022-07-03 DIAGNOSIS — R319 Hematuria, unspecified: Secondary | ICD-10-CM | POA: Diagnosis not present

## 2022-07-03 DIAGNOSIS — R3121 Asymptomatic microscopic hematuria: Secondary | ICD-10-CM | POA: Diagnosis not present

## 2022-07-05 ENCOUNTER — Other Ambulatory Visit: Payer: Self-pay | Admitting: Urology

## 2022-07-05 DIAGNOSIS — R3129 Other microscopic hematuria: Secondary | ICD-10-CM

## 2022-07-14 ENCOUNTER — Ambulatory Visit
Admission: RE | Admit: 2022-07-14 | Discharge: 2022-07-14 | Disposition: A | Discharge status: Home / Self Care | Payer: BC Managed Care – PPO | Source: Ambulatory Visit | Attending: Urology | Admitting: Urology

## 2022-07-14 DIAGNOSIS — D734 Cyst of spleen: Secondary | ICD-10-CM | POA: Diagnosis not present

## 2022-07-14 DIAGNOSIS — R3129 Other microscopic hematuria: Secondary | ICD-10-CM

## 2022-07-14 DIAGNOSIS — N3289 Other specified disorders of bladder: Secondary | ICD-10-CM | POA: Diagnosis not present

## 2022-07-14 MED ORDER — GADOBENATE DIMEGLUMINE 529 MG/ML IV SOLN
19.0000 mL | Freq: Once | INTRAVENOUS | Status: AC | PRN
  Administered 2022-07-14: 19 mL via INTRAVENOUS

## 2022-08-10 DIAGNOSIS — R8271 Bacteriuria: Secondary | ICD-10-CM | POA: Diagnosis not present

## 2022-08-10 DIAGNOSIS — R3914 Feeling of incomplete bladder emptying: Secondary | ICD-10-CM | POA: Diagnosis not present

## 2022-08-10 DIAGNOSIS — N35011 Post-traumatic bulbous urethral stricture: Secondary | ICD-10-CM | POA: Diagnosis not present

## 2022-08-18 ENCOUNTER — Other Ambulatory Visit: Payer: Self-pay | Admitting: Urology

## 2022-08-30 ENCOUNTER — Encounter: Payer: BC Managed Care – PPO | Admitting: Family Medicine

## 2022-09-06 ENCOUNTER — Encounter (HOSPITAL_BASED_OUTPATIENT_CLINIC_OR_DEPARTMENT_OTHER): Payer: Self-pay | Admitting: Urology

## 2022-09-07 ENCOUNTER — Encounter (HOSPITAL_BASED_OUTPATIENT_CLINIC_OR_DEPARTMENT_OTHER): Payer: Self-pay | Admitting: Urology

## 2022-09-07 NOTE — Progress Notes (Addendum)
Spoke w/ via phone for pre-op interview--- pt Lab needs dos----no               Lab results------ no COVID test -----patient states asymptomatic no test needed Arrive at ------- 0630 on 10-02-2022 NPO after MN NO Solid Food.  Clear liquids from MN until--- 0530 Med rec completed Medications to take morning of surgery ----- celexa, artane, flomax Diabetic medication ----- n/a Patient instructed no nail polish to be worn day of surgery Patient instructed to bring photo id and insurance card day of surgery Patient aware to have Driver (ride ) / caregiver    for 24 hours after surgery -- wife, cassio[place] Patient Special Instructions ----- n/a Pre-Op special Istructions ----- n/a Patient verbalized understanding of instructions that were given at this phone interview. Patient denies shortness of breath, chest pain, fever, cough at this phone interview.   Anesthesia:  pt stated he is able to open mouth wide without issue

## 2022-09-08 DIAGNOSIS — N35011 Post-traumatic bulbous urethral stricture: Secondary | ICD-10-CM | POA: Diagnosis not present

## 2022-09-08 DIAGNOSIS — R8271 Bacteriuria: Secondary | ICD-10-CM | POA: Diagnosis not present

## 2022-09-08 DIAGNOSIS — N3941 Urge incontinence: Secondary | ICD-10-CM | POA: Diagnosis not present

## 2022-09-08 NOTE — H&P (Signed)
I was consulted to assess the patient's urinary incontinence and microscopic hematuria. In the last few months he was told he had microscopic hematuria. Does not take daily aspirin or blood thinners and does not smoke no gross hematuria   In the last 2 to 3 months he has had bedwetting mild to moderate 5 out of 7 days a week. 1 month ago he had a high-volume episode. During the day he has urge incontinence and wears 2 or 3 pads a day that are damp. No stress incontinence   He voids every 2-3 hours gets up 3-4 times a night   For 2 to 3 years or longer he has had a slow flow. He sits to urinate. He strains most times. If he does not strain he voids a small amount. He can stop and start. Sometimes he does not feel empty. He is on Flomax 0.4 mg and it helps some.   His neurologist at [facility] thinks he has developed jaw spasms secondary to a foreign body reaction from wearing a mask during [place]. He has no neurologic complaints from the neck down. No local neurologic symptoms. He takes treatment including Botox for it   Male genitalia normal. Normal perineal sensation. He had mild stenosis in the rectal area causing discomfort with digital rectal examination so I stopped.   Urine positive for blood and bacteria and sent for culture. Chart reviewed. Bladder scan 549 mL   Patient has significant flow symptoms especially for his age. He has blood in the urine although he may have a bladder infection. Having said that I will order a CT scan hematuria. I will increase Flomax to .8 mg and have him come back and check another residual. I thought it was reasonable today based on the urine to give him ciprofloxacin 250 mg twice a day for 7 days. Eventually I will order a PSA since I was unable to do a digital rectal examination but I want to measure in the face of a possible bladder infection. I will perform cystoscopy next visit. Unless he has a stricture he would need urodynamics. Other than the above story he does  not have other neurologic issues from the neck down.   I decided to get the PSA since that was getting lab test for his renal function today. If his PSA is elevated I will repeat it in the future. The patient was teasing me about her dinner and a movie with always has some doing   TOday  Frequency stable. PSA was 1.71. He had a CT scan with a mild spleen abnormality that was cleared on MRI. On both x-rays he had a thickened bladder in keeping with neurogenic bladder and or bladder outlet obstruction. Once again no neurologic symptoms from the neck down. Was diagnosed in 2020.  His bladder symptoms date back approximately 1 year   He was bladder scanned today for 846 mL   Patient underwent flexible cystoscopy. The penile bulbar urethra distally looked normal. In the proximal bulbar urethra he had approximately an 8 French stricture. I could not see beyond the stricture. The urothelium looked healthy distally   Picture was drawn. Patient understands he could have a thickened bladder due to an otherwise asymptomatic chronic urethral stricture. I went over cystoscopy retrograde urethrogram and balloon dilatation with a new balloon in detail. He understands that theoretically this could help his ability to empty. His residual still could be elevated but he is young to have benign prostatic hyperplasia. His urge  incontinence could improve stay the same or worsen and this was discussed. Recognizing its distal to the sphincter it would be uncommon to worsen his stricture. Based on complexity of problem he understands stricture might not be the underlying problem and we would need to order urodynamics in the future. Injury and sequelae discussed. Recurrence rate discussed.   I discussed the workup of his hematuria again. He did not have blood in the urine today. He was also consented for bladder biopsy and/or transurethral section of the prostate with complications and sequelae if needed. I would asked one of  my partners to join me if he did have an unsuspected tumor in his bladder which is unlikely. Bladder perforation and sequelae discussed   I think he is very well-educated on the issue and he knows that I am trying to sort out prostate obstruction for young man versus stricture causing chronic obstruction versus neurogenic bladder. He understands catheter will be left in as per protocol for the new balloon dilation catheter for a few days afterwards. We can always put him off work for a few days if needed   Today  Flow stable. Frequency stable  Patient and his wife came in and I went through the entire situation with some covering slow flow thickened bladder bedwetting urodynamics possibly as well as other issues. I went over the usual postoperative course and procedure again. I answered many questions. I think there are pleaser discussions. I will see him on Tuesday.     ALLERGIES: Amoxicillin Cillin Family  Penicillin    MEDICATIONS: Flomax 0.4 mg capsule 2 capsule PO Daily  Trihexyphenidyl Hcl 2 mg tablet 1 tablet PO Daily     GU PSH: Cystoscopy - 08/10/2022 Locm 300-'399Mg'$ /Ml Iodine,1Ml - 07/03/2022     NON-GU PSH: Repair Achilles Tendon - about 2001     GU PMH: Bulbar urethral stricture - 08/10/2022 Incomplete bladder emptying - 08/10/2022, - 05/23/2022 Microscopic hematuria - 07/03/2022, - 05/23/2022 Nocturnal Enuresis - 05/23/2022 Urge incontinence - 05/23/2022    NON-GU PMH: Asthma    FAMILY HISTORY: 1 - Son, Daughter Deceased - Father Kidney Stones - Mother Prostate Cancer - Father   SOCIAL HISTORY: Marital Status: Married Preferred Language: English; Ethnicity: Not Hispanic Or Latino; Race: Black or African American Current Smoking Status: Patient has never smoked.   Tobacco Use Assessment Completed: Used Tobacco in last 30 days? Does not use smokeless tobacco. Drinks 2 drinks per week.  Drinks 1 caffeinated drink per day. Patient's occupation [employer].    REVIEW OF SYSTEMS:    GU Review Male:   Patient reports frequent urination, hard to postpone urination, get up at night to urinate, leakage of urine, trouble starting your stream, and have to strain to urinate . Patient denies burning/ pain with urination, stream starts and stops, erection problems, and penile pain.  Gastrointestinal (Upper):   Patient denies nausea, indigestion/ heartburn, and vomiting.  Gastrointestinal (Lower):   Patient denies diarrhea and constipation.  Constitutional:   Patient reports night sweats. Patient denies fever, weight loss, and fatigue.  Skin:   Patient denies skin rash/ lesion and itching.  Eyes:   Patient denies blurred vision and double vision.  Ears/ Nose/ Throat:   Patient denies sore throat and sinus problems.  Hematologic/Lymphatic:   Patient denies swollen glands and easy bruising.  Cardiovascular:   Patient denies leg swelling and chest pains.  Respiratory:   Patient denies cough and shortness of breath.  Endocrine:   Patient  denies excessive thirst.  Musculoskeletal:   Patient denies back pain and joint pain.  Neurological:   Patient denies headaches and dizziness.  Psychologic:   Patient denies depression and anxiety.   VITAL SIGNS: None   Complexity of Data:   05/23/22  PSA  Total PSA 1.71 ng/mL    PROCEDURES:          Urinalysis w/Scope Dipstick Dipstick Cont'd Micro  Color: Amber Bilirubin: Neg mg/dL WBC/hpf: 10 - 20/hpf  Appearance: Cloudy Ketones: Neg mg/dL RBC/hpf: NS (Not Seen)  Specific Gravity: 1.015 Blood: Neg ery/uL Bacteria: Many (>50/hpf)  pH: 6.0 Protein: Neg mg/dL Cystals: Amorph Urates  Glucose: Neg mg/dL Urobilinogen: 0.2 mg/dL Casts: NS (Not Seen)    Nitrites: Neg Trichomonas: Not Present    Leukocyte Esterase: 3+ leu/uL Mucous: Not Present      Epithelial Cells: 6 - 10/hpf      Yeast: NS (Not Seen)      Sperm: Not Present    Notes: many transitional epithellium seen    ASSESSMENT:      ICD-10  Details  1 GU:   Bulbar urethral stricture - N35.011   2   Urge incontinence - N39.41      PLAN:           Orders Labs Urine Culture          Schedule Return Visit/Planned Activity: Keep Scheduled Appointment - Office Visit

## 2022-09-11 ENCOUNTER — Ambulatory Visit (HOSPITAL_BASED_OUTPATIENT_CLINIC_OR_DEPARTMENT_OTHER): Payer: BC Managed Care – PPO | Admitting: Anesthesiology

## 2022-09-11 ENCOUNTER — Other Ambulatory Visit: Payer: Self-pay | Admitting: Urology

## 2022-09-11 NOTE — Anesthesia Preprocedure Evaluation (Signed)
Anesthesia Evaluation    Reviewed: Allergy & Precautions, H&P , Patient's Chart, lab work & pertinent test results  Airway        Dental   Pulmonary neg pulmonary ROS, asthma , sleep apnea           Cardiovascular Exercise Tolerance: Good negative cardio ROS      Neuro/Psych   Anxiety     negative neurological ROS  negative psych ROS   GI/Hepatic negative GI ROS, Neg liver ROS,,,  Endo/Other  negative endocrine ROS    Renal/GU negative Renal ROS  negative genitourinary   Musculoskeletal  (+) Arthritis , Osteoarthritis,    Abdominal   Peds  Hematology negative hematology ROS (+)   Anesthesia Other Findings   Reproductive/Obstetrics negative OB ROS                             Anesthesia Physical Anesthesia Plan  ASA: 2  Anesthesia Plan: General   Post-op Pain Management: Tylenol PO (pre-op)*   Induction: Intravenous  PONV Risk Score and Plan: 3 and Ondansetron, Dexamethasone and Midazolam  Airway Management Planned: LMA  Additional Equipment:   Intra-op Plan:   Post-operative Plan: Extubation in OR  Informed Consent: I have reviewed the patients History and Physical, chart, labs and discussed the procedure including the risks, benefits and alternatives for the proposed anesthesia with the patient or authorized representative who has indicated his/her understanding and acceptance.       Plan Discussed with:   Anesthesia Plan Comments: (Case cancelled. Pt has temp of 103.)       Anesthesia Quick Evaluation

## 2022-09-12 ENCOUNTER — Encounter (HOSPITAL_BASED_OUTPATIENT_CLINIC_OR_DEPARTMENT_OTHER): Payer: Self-pay | Admitting: Urology

## 2022-09-12 ENCOUNTER — Encounter: Payer: Self-pay | Admitting: Family Medicine

## 2022-09-12 ENCOUNTER — Ambulatory Visit (INDEPENDENT_AMBULATORY_CARE_PROVIDER_SITE_OTHER): Payer: BC Managed Care – PPO | Admitting: Family Medicine

## 2022-09-12 ENCOUNTER — Ambulatory Visit (HOSPITAL_BASED_OUTPATIENT_CLINIC_OR_DEPARTMENT_OTHER)
Admission: RE | Admit: 2022-09-12 | Discharge: 2022-09-12 | Disposition: A | Discharge status: Home / Self Care | Payer: BC Managed Care – PPO | Attending: Urology | Admitting: Urology

## 2022-09-12 ENCOUNTER — Encounter (HOSPITAL_BASED_OUTPATIENT_CLINIC_OR_DEPARTMENT_OTHER)
Admission: RE | Disposition: A | Discharge status: Home / Self Care | Payer: Self-pay | Source: Home / Self Care | Attending: Urology

## 2022-09-12 VITALS — BP 120/80 | HR 105 | Temp 100.3°F | Resp 12 | Ht 69.5 in | Wt 195.2 lb

## 2022-09-12 DIAGNOSIS — R509 Fever, unspecified: Secondary | ICD-10-CM

## 2022-09-12 DIAGNOSIS — R6883 Chills (without fever): Secondary | ICD-10-CM

## 2022-09-12 DIAGNOSIS — Z01818 Encounter for other preprocedural examination: Secondary | ICD-10-CM

## 2022-09-12 HISTORY — DX: Unspecified urethral stricture, male, unspecified site: N35.919

## 2022-09-12 HISTORY — DX: Prediabetes: R73.03

## 2022-09-12 HISTORY — DX: Retention of urine, unspecified: R33.9

## 2022-09-12 HISTORY — DX: Benign prostatic hyperplasia with lower urinary tract symptoms: N40.1

## 2022-09-12 HISTORY — DX: Hematuria, unspecified: R31.9

## 2022-09-12 HISTORY — DX: Idiopathic orofacial dystonia: G24.4

## 2022-09-12 HISTORY — DX: Presence of spectacles and contact lenses: Z97.3

## 2022-09-12 HISTORY — DX: Generalized anxiety disorder: F41.1

## 2022-09-12 HISTORY — DX: Urge incontinence: N39.41

## 2022-09-12 LAB — CBC WITH DIFFERENTIAL/PLATELET
Basophils Absolute: 0 10*3/uL (ref 0.0–0.1)
Basophils Relative: 0.2 % (ref 0.0–3.0)
Eosinophils Absolute: 0 10*3/uL (ref 0.0–0.7)
Eosinophils Relative: 0.1 % (ref 0.0–5.0)
HCT: 37.5 % — ABNORMAL LOW (ref 39.0–52.0)
Hemoglobin: 12.9 g/dL — ABNORMAL LOW (ref 13.0–17.0)
Lymphocytes Relative: 15.1 % (ref 12.0–46.0)
Lymphs Abs: 1.7 10*3/uL (ref 0.7–4.0)
MCHC: 34.3 g/dL (ref 30.0–36.0)
MCV: 90.1 fl (ref 78.0–100.0)
Monocytes Absolute: 1.8 10*3/uL — ABNORMAL HIGH (ref 0.1–1.0)
Monocytes Relative: 16.2 % — ABNORMAL HIGH (ref 3.0–12.0)
Neutro Abs: 7.5 10*3/uL (ref 1.4–7.7)
Neutrophils Relative %: 68.4 % (ref 43.0–77.0)
Platelets: 324 10*3/uL (ref 150.0–400.0)
RBC: 4.17 Mil/uL — ABNORMAL LOW (ref 4.22–5.81)
RDW: 14.3 % (ref 11.5–15.5)
WBC: 10.9 10*3/uL — ABNORMAL HIGH (ref 4.0–10.5)

## 2022-09-12 LAB — BASIC METABOLIC PANEL
BUN: 11 mg/dL (ref 6–23)
CO2: 27 mEq/L (ref 19–32)
Calcium: 9.2 mg/dL (ref 8.4–10.5)
Chloride: 97 mEq/L (ref 96–112)
Creatinine, Ser: 1.01 mg/dL (ref 0.40–1.50)
GFR: 87.43 mL/min (ref >60.00)
Glucose, Bld: 98 mg/dL (ref 70–99)
Potassium: 3.5 mEq/L (ref 3.5–5.1)
Sodium: 135 mEq/L (ref 135–145)

## 2022-09-12 LAB — HEPATIC FUNCTION PANEL
ALT: 18 U/L (ref 0–53)
AST: 14 U/L (ref 0–37)
Albumin: 4 g/dL (ref 3.5–5.2)
Alkaline Phosphatase: 49 U/L (ref 39–117)
Bilirubin, Direct: 0.2 mg/dL (ref 0.0–0.3)
Total Bilirubin: 0.6 mg/dL (ref 0.2–1.2)
Total Protein: 7.8 g/dL (ref 6.0–8.3)

## 2022-09-12 LAB — POCT INFLUENZA A/B
Influenza A, POC: NEGATIVE
Influenza B, POC: NEGATIVE

## 2022-09-12 LAB — C-REACTIVE PROTEIN: CRP: 16.6 mg/dL (ref 0.5–20.0)

## 2022-09-12 LAB — POC COVID19 BINAXNOW: SARS Coronavirus 2 Ag: NEGATIVE

## 2022-09-12 SURGERY — CYSTOSCOPY WITH RETROGRADE URETHROGRAM
Anesthesia: General

## 2022-09-12 MED ORDER — LIDOCAINE HCL (PF) 2 % IJ SOLN
INTRAMUSCULAR | Status: AC
  Filled 2022-09-12: qty 5

## 2022-09-12 MED ORDER — CIPROFLOXACIN IN D5W 400 MG/200ML IV SOLN: 400.0000 mg | INTRAVENOUS | Status: DC

## 2022-09-12 MED ORDER — PROPOFOL 10 MG/ML IV BOLUS
INTRAVENOUS | Status: AC
  Filled 2022-09-12: qty 20

## 2022-09-12 MED ORDER — ACETAMINOPHEN 500 MG PO TABS: 1000.0000 mg | ORAL_TABLET | Freq: Once | ORAL | Status: DC

## 2022-09-12 MED ORDER — LACTATED RINGERS IV SOLN: INTRAVENOUS | Status: DC

## 2022-09-12 MED ORDER — MIDAZOLAM HCL 2 MG/2ML IJ SOLN
INTRAMUSCULAR | Status: AC
  Filled 2022-09-12: qty 2

## 2022-09-12 MED ORDER — FENTANYL CITRATE (PF) 100 MCG/2ML IJ SOLN
INTRAMUSCULAR | Status: AC
  Filled 2022-09-12: qty 2

## 2022-09-12 SURGICAL SUPPLY — 22 items
BALLN NEPHROSTOMY (BALLOONS)
BALLOON NEPHROSTOMY (BALLOONS) IMPLANT
CATH FOLEY 2WAY SLVR  5CC 18FR (CATHETERS)
CATH FOLEY 2WAY SLVR 5CC 18FR (CATHETERS) IMPLANT
CATH ROBINSON RED A/P 12FR (CATHETERS) IMPLANT
CATH URET 5FR 28IN CONE TIP (BALLOONS)
CATH URET 5FR 70CM CONE TIP (BALLOONS) IMPLANT
ELECT REM PT RETURN 9FT ADLT (ELECTROSURGICAL)
ELECTRODE REM PT RTRN 9FT ADLT (ELECTROSURGICAL) IMPLANT
GOWN STRL REUS W/TWL LRG LVL3 (GOWN DISPOSABLE) ×2 IMPLANT
GUIDEWIRE STR DUAL SENSOR (WIRE) ×1 IMPLANT
KIT TURNOVER CYSTO (KITS) ×1 IMPLANT
MANIFOLD NEPTUNE II (INSTRUMENTS) ×2 IMPLANT
NDL SAFETY ECLIP 18X1.5 (MISCELLANEOUS) ×2 IMPLANT
NS IRRIG 500ML POUR BTL (IV SOLUTION) IMPLANT
PACK CYSTO (CUSTOM PROCEDURE TRAY) ×2 IMPLANT
SUT SILK 0 TIES 10X30 (SUTURE) IMPLANT
SYR 20ML LL LF (SYRINGE) ×2 IMPLANT
SYR TOOMEY IRRIG 70ML (MISCELLANEOUS)
SYRINGE TOOMEY IRRIG 70ML (MISCELLANEOUS) IMPLANT
TUBE CONNECTING 12X1/4 (SUCTIONS) IMPLANT
WATER STERILE IRR 3000ML UROMA (IV SOLUTION) ×2 IMPLANT

## 2022-09-12 NOTE — Progress Notes (Signed)
Upon arrival, patient had a temperature of 103. It was then rechecked with a different thermometer, with a temperature of 103.2. When asked if he had been feeling bad, he stated that he felt fatigued, and had night sweats. Called surgeon and left message. Called anesthesia, who cancelled the case.

## 2022-09-12 NOTE — Progress Notes (Unsigned)
ACUTE VISIT Chief Complaint  Patient presents with   Fever    Went to [place] for work last week, started feeling bad. Tested negative for covid. Was feeling better Thursday when he came back home, started back with symptoms yesterday. Covid & flu test negative today.   HPI: [name] is a 52 y.o. male, who is here today complaining of *** HPI f intermittent fever, extreme fatigue, occasional chills, and night sweats. He denies experiencing sore throat, nasal congestion, body aches, cough, wheezing, difficulty breathing, nausea, vomiting, or diarrhea. He reports having a headache at the beginning of the illness but has not experienced one since.  He denies any recent sick contacts with similar symptoms. He mentions traveling for work last week and feeling unwell during the trip, spending two days resting in a hotel room. He was tested for COVID-19 and influenza, both of which were negative.  He was scheduled for a urological procedure (cystology) this morning, which was canceled. He denies any new urinary symptoms, such as pain during urination or changes in frequency. The urologist prescribed Cipro (ciprofloxacin) for a possible bladder issue after observing skin tissue in his urine. He denies any rectal pain, increased urinary frequency, or difficulty starting urination.  He reports feeling extremely tired yesterday, sleeping from 5:30 PM to 11:30 PM, and eating only a small amount of food before returning to bed. He has not noticed any new exposures, such as mosquito bites or unusual food consumption, during recent travels to [place]. Review of Systems See other pertinent positives and negatives in HPI.  Current Outpatient Medications on File Prior to Visit  Medication Sig Dispense Refill   citalopram (CELEXA) 20 MG tablet Take 1 tablet (20 mg total) by mouth daily. 30 tablet 3   diazepam (VALIUM) 5 MG tablet Take 1 tablet (5 mg total) by mouth daily as needed for  anxiety (No more than 15 tabs per month.). (Patient taking differently: Take 5 mg by mouth daily as needed for anxiety (No more than 15 tabs per month.).) 15 tablet 2   Multiple Vitamins-Minerals (CENTRUM ADULTS PO) Take by mouth daily.     tamsulosin (FLOMAX) 0.4 MG CAPS capsule TAKE 1 CAPSULE(0.4 MG) BY MOUTH DAILY (Patient taking differently: Take 0.8 mg by mouth daily.) 90 capsule 2   trihexyphenidyl (ARTANE) 2 MG tablet Take 2 mg by mouth 3 (three) times daily with meals. 3 times daily with meals     No current facility-administered medications on file prior to visit.    Past Medical History:  Diagnosis Date   Arthritis    Asthma    09-07-2022 per pt very mild,  does not have an inhaler   BPH associated with nocturia    per pt  2-5 times nightly   GAD (generalized anxiety disorder)    Hematuria    Incomplete emptying of bladder    Mild obstructive sleep apnea 09/2017   study in epic 09-27-2017  (09-07-2022  per pt had used cpap for 90 days then told did need to use anymore)  recommendation's positioning, lose weight, if symptomatic oral appliance or cpap   Oromandibular dystonia    neurologist--- dr Lezlie Octave [facility])  treated w/ botox injections and PT;   per pt of upper and lower jaw due to mask during covid,  hard to chew at times- has spasms   Urethral stricture    Urge incontinence of urine    Wears glasses    Allergies  Allergen Reactions  Penicillins Rash    Social History   Socioeconomic History   Marital status: Single    Spouse name: Not on file   Number of children: Not on file   Years of education: Not on file   Highest education level: Not on file  Occupational History   Not on file  Tobacco Use   Smoking status: Never   Smokeless tobacco: Never  Vaping Use   Vaping Use: Never used  Substance and Sexual Activity   Alcohol use: Yes    Alcohol/week: 1.0 standard drink of alcohol    Types: 1 Standard drinks or equivalent per week   Drug use: Never    Sexual activity: Not on file  Other Topics Concern   Not on file  Social History Narrative   Not on file   Social Determinants of Health   Financial Resource Strain: Not on file  Food Insecurity: Not on file  Transportation Needs: Not on file  Physical Activity: Not on file  Stress: Not on file  Social Connections: Not on file   Vitals:   09/12/22 1135  BP: 120/80  Pulse: (!) 105  Resp: 12  Temp: 100.3 F (37.9 C)  SpO2: 97%   Body mass index is 28.42 kg/m.  Physical Exam Nursing note reviewed.  Constitutional:      General: He is not in acute distress.    Appearance: He is well-developed.  HENT:     Head: Normocephalic and atraumatic.     Right Ear: Tympanic membrane, ear canal and external ear normal.     Left Ear: Tympanic membrane, ear canal and external ear normal.  Eyes:     Conjunctiva/sclera: Conjunctivae normal.  Cardiovascular:     Rate and Rhythm: Regular rhythm. Tachycardia present.     Pulses:          Dorsalis pedis pulses are 2+ on the right side and 2+ on the left side.     Heart sounds: No murmur heard. Pulmonary:     Effort: Pulmonary effort is normal. No respiratory distress.     Breath sounds: Normal breath sounds.  Abdominal:     Palpations: Abdomen is soft. There is no hepatomegaly, splenomegaly or mass.     Tenderness: There is no abdominal tenderness. There is no right CVA tenderness or left CVA tenderness.  Musculoskeletal:     Right lower leg: No edema.     Left lower leg: No edema.  Lymphadenopathy:     Cervical: No cervical adenopathy.  Skin:    General: Skin is warm.     Findings: No erythema or rash.  Neurological:     Mental Status: He is alert and oriented to person, place, and time.     Cranial Nerves: No cranial nerve deficit.     Gait: Gait normal.  Psychiatric:     Comments: Well groomed, good eye contact.   ASSESSMENT AND PLAN:  Sean Romero was seen today for fever.  Diagnoses and all orders for this  visit:  Fever, unspecified fever cause -     POC COVID-19 -     POCT Influenza A/B -     Basic metabolic panel; Future -     CBC with Differential/Platelet; Future -     C-reactive protein; Future -     Hepatic function panel; Future -     Hepatic function panel -     C-reactive protein -     CBC with Differential/Platelet -     Basic  metabolic panel  Chills -     POC COVID-19 -     POCT Influenza A/B  Fever, unspecified fever cause -     POC COVID-19 BinaxNow -     POCT Influenza A/B -     Basic metabolic panel; Future -     CBC with Differential/Platelet; Future -     C-reactive protein; Future -     Hepatic function panel; Future  Chills -     POC COVID-19 BinaxNow -     POCT Influenza A/B    Return if symptoms worsen or fail to improve.  Ebbie Cherry G. [place], MD  [facility]. [place] office.

## 2022-09-12 NOTE — Patient Instructions (Signed)
A few things to remember from today's visit:  Fever, unspecified fever cause - Plan: POC COVID-19, POCT Influenza A/B, Basic metabolic panel, CBC with Differential/Platelet, C-reactive protein, Hepatic function panel  Chills - Plan: POC COVID-19, POCT Influenza A/B  Most likely a viral infection but urologic causes can also cause fever, ? Prostate or bladder infection; so start antibiotic treatment your urologist prescribed. Monitor for new symptoms. Rest and hydration. Tylenol 500 mg 3-4 times per day as needed, monitor temp before taking it.  If you need refills for medications you take chronically, please call your pharmacy. Do not use My Chart to request refills or for acute issues that need immediate attention. If you send a my chart message, it may take a few days to be addressed, specially if I am not in the office.  Please be sure medication list is accurate. If a new problem present, please set up appointment sooner than planned today.

## 2022-09-13 ENCOUNTER — Telehealth: Payer: Self-pay | Admitting: Family Medicine

## 2022-09-13 ENCOUNTER — Encounter: Payer: Self-pay | Admitting: Family Medicine

## 2022-09-13 NOTE — Telephone Encounter (Signed)
error 

## 2022-09-13 NOTE — Telephone Encounter (Signed)
Pt's spouse calling back for lab results. Says she is concerned, patient has become ill. Requesting a call.

## 2022-09-13 NOTE — Telephone Encounter (Signed)
Pt was seen yesterday, 09/12/22. Pt is asking for a call back to go over his lab results.

## 2022-09-13 NOTE — Telephone Encounter (Signed)
See result note.  

## 2022-09-20 NOTE — Progress Notes (Signed)
HPI: [name] is a 52 y.o.male with PMHx significant for HLD,anxiety,BPH, and dystonia here today for his routine physical examination.  Last CPE: 08/29/21  Until recent illness he was exercising regularly, engaging in kettlebell workouts for 30 minutes each morning before he got sick.  He reports trying to eat healthily in general. He cooks three to four times a week, he eats meat, starch, and vegetables in each meals. He is trying to increase oral intake, due to repetitive, involuntary facial movements, it is hard for him to chew for long periods of time, states that his TMJ gets tired. Due to work schedule sometimes we cannot eat frequently during the day.  He does not smoke and consumes minimal alcohol.  He sleeps approximately 6.5 to 7 hours per night.  Immunization History  Administered Date(s) Administered   Influenza,inj,Quad PF,6+ Mos 05/27/2013, 09/11/2016, 08/20/2018, 05/13/2019   PFIZER(Purple Top)SARS-COV-2 Vaccination 10/20/2019, 11/11/2019, 06/22/2020   Tdap 12/16/2014, 05/13/2018   Health Maintenance  Topic Date Due   COVID-19 Vaccine (4 - 2023-24 season) 10/08/2022 (Originally 04/07/2022)   INFLUENZA VACCINE  11/05/2022 (Originally 03/07/2022)   HIV Screening  09/23/2023 (Originally 05/10/1986)   DTaP/Tdap/Td (3 - Td or Tdap) 05/13/2028   COLONOSCOPY (Pts 45-11yr Insurance coverage will need to be confirmed)  09/10/2030   Hepatitis C Screening  Completed   HPV VACCINES  Aged Out   Hyperlipidemia on nonpharmacologic treatment. Lab Results  Component Value Date   CHOL 214 (H) 08/29/2021   HDL 58.20 08/29/2021   LDLCALC 143 (H) 08/29/2021   LDLDIRECT 138.9 05/27/2013   TRIG 68.0 08/29/2021   CHOLHDL 4 08/29/2021   He was recently seen for fever, 09/12/22. He reports feeling better after completing abx treatment, Cipro, prescribed by his urologist. Blood work was negative except for mildly abnormal CBC.  Lab Results  Component Value Date   WBC 10.9 (H)  09/12/2022   HGB 12.9 (L) 09/12/2022   HCT 37.5 (L) 09/12/2022   MCV 90.1 09/12/2022   PLT 324.0 09/12/2022   Lab Results  Component Value Date   CREATININE 1.01 09/12/2022   BUN 11 09/12/2022   NA 135 09/12/2022   K 3.5 09/12/2022   CL 97 09/12/2022   CO2 27 09/12/2022   Lab Results  Component Value Date   ALT 18 09/12/2022   AST 14 09/12/2022   ALKPHOS 49 09/12/2022   BILITOT 0.6 09/12/2022   Lab Results  Component Value Date   CRP 16.6 09/12/2022   Anxiety on Celexa 20 mg daily and Valium 5 mg daily as needed, the latter one also helps with facial dystonia. BPH: He follows with urology regularly, awaiting a call for scheduling cystoscopy.  Dystonia: Follows with ENT and neurologist.  He has not seen provider for about 6 months due to work schedule and frequent traveling. He follows with eye care provider annually.  Review of Systems  Constitutional:  Negative for activity change, appetite change and fever.  HENT:  Negative for nosebleeds, sore throat and trouble swallowing.   Eyes:  Negative for redness and visual disturbance.  Respiratory:  Negative for apnea, cough, shortness of breath and wheezing.   Cardiovascular:  Negative for chest pain, palpitations and leg swelling.  Gastrointestinal:  Negative for abdominal pain, blood in stool, nausea and vomiting.  Endocrine: Negative for cold intolerance, heat intolerance, polydipsia, polyphagia and polyuria.  Genitourinary:  Negative for decreased urine volume, dysuria, genital sores, hematuria and testicular pain.  Musculoskeletal:  Negative for gait problem  and myalgias.  Skin:  Negative for color change and rash.  Allergic/Immunologic: Negative for environmental allergies.  Neurological:  Negative for dizziness, syncope, weakness, numbness and headaches.  Hematological:  Negative for adenopathy. Does not bruise/bleed easily.  Psychiatric/Behavioral:  Negative for confusion and sleep disturbance. The patient is  nervous/anxious.   All other systems reviewed and are negative.  Current Outpatient Medications on File Prior to Visit  Medication Sig Dispense Refill   citalopram (CELEXA) 20 MG tablet Take 1 tablet (20 mg total) by mouth daily. 30 tablet 3   diazepam (VALIUM) 5 MG tablet Take 1 tablet (5 mg total) by mouth daily as needed for anxiety (No more than 15 tabs per month.). (Patient taking differently: Take 5 mg by mouth daily as needed for anxiety (No more than 15 tabs per month.).) 15 tablet 2   Multiple Vitamins-Minerals (CENTRUM ADULTS PO) Take by mouth daily.     tamsulosin (FLOMAX) 0.4 MG CAPS capsule TAKE 1 CAPSULE(0.4 MG) BY MOUTH DAILY (Patient taking differently: Take 0.8 mg by mouth daily.) 90 capsule 2   trihexyphenidyl (ARTANE) 2 MG tablet Take 2 mg by mouth 3 (three) times daily with meals. 3 times daily with meals     No current facility-administered medications on file prior to visit.   Past Medical History:  Diagnosis Date   Arthritis    Asthma    09-07-2022 per pt very mild,  does not have an inhaler   BPH associated with nocturia    per pt  2-5 times nightly   GAD (generalized anxiety disorder)    Hematuria    Incomplete emptying of bladder    Mild obstructive sleep apnea 09/2017   study in epic 09-27-2017  (09-07-2022  per pt had used cpap for 90 days then told did need to use anymore)  recommendation's positioning, lose weight, if symptomatic oral appliance or cpap   Oromandibular dystonia    neurologist--- dr Lezlie Octave [facility])  treated w/ botox injections and PT;   per pt of upper and lower jaw due to mask during covid,  hard to chew at times- has spasms   Urethral stricture    Urge incontinence of urine    Wears glasses    Past Surgical History:  Procedure Laterality Date   ACHILLES TENDON REPAIR Left 2001   Allergies  Allergen Reactions   Penicillins Rash   Family History  Problem Relation Age of Onset   Prostate cancer Father    Colon polyps Mother         TA +    Colon cancer Paternal Grandmother    Esophageal cancer Neg Hx    Rectal cancer Neg Hx    Stomach cancer Neg Hx    Social History   Socioeconomic History   Marital status: Single    Spouse name: Not on file   Number of children: Not on file   Years of education: Not on file   Highest education level: Not on file  Occupational History   Not on file  Tobacco Use   Smoking status: Never   Smokeless tobacco: Never  Vaping Use   Vaping Use: Never used  Substance and Sexual Activity   Alcohol use: Yes    Alcohol/week: 1.0 standard drink of alcohol    Types: 1 Standard drinks or equivalent per week   Drug use: Never   Sexual activity: Not on file  Other Topics Concern   Not on file  Social History Narrative  Not on file   Social Determinants of Health   Financial Resource Strain: Not on file  Food Insecurity: Not on file  Transportation Needs: Not on file  Physical Activity: Not on file  Stress: Not on file  Social Connections: Not on file   Vitals:   09/22/22 0700  BP: 120/70  Pulse: 95  Resp: 12  Temp: 98.5 F (36.9 C)  SpO2: 98%  Body mass index is 28.26 kg/m. Wt Readings from Last 3 Encounters:  09/22/22 194 lb 2 oz (88.1 kg)  09/12/22 195 lb 4 oz (88.6 kg)  09/07/22 200 lb (90.7 kg)  Physical Exam Vitals and nursing note reviewed.  Constitutional:      General: He is not in acute distress.    Appearance: He is well-developed.  HENT:     Head: Normocephalic and atraumatic.     Right Ear: Tympanic membrane, ear canal and external ear normal.     Left Ear: Tympanic membrane, ear canal and external ear normal.     Mouth/Throat:     Mouth: Mucous membranes are moist.     Pharynx: Oropharynx is clear.  Eyes:     Extraocular Movements: Extraocular movements intact.     Conjunctiva/sclera: Conjunctivae normal.     Pupils: Pupils are equal, round, and reactive to light.  Neck:     Thyroid: No thyromegaly.     Trachea: No tracheal deviation.   Cardiovascular:     Rate and Rhythm: Normal rate and regular rhythm.     Pulses:          Dorsalis pedis pulses are 2+ on the right side and 2+ on the left side.     Heart sounds: No murmur heard. Pulmonary:     Effort: Pulmonary effort is normal. No respiratory distress.     Breath sounds: Normal breath sounds.  Abdominal:     Palpations: Abdomen is soft. There is no hepatomegaly or mass.     Tenderness: There is no abdominal tenderness.  Genitourinary:    Comments: No concerns. Musculoskeletal:        General: No tenderness.     Cervical back: Normal range of motion.     Comments: No major deformities appreciated and no signs of synovitis.  Lymphadenopathy:     Cervical: No cervical adenopathy.     Upper Body:     Right upper body: No supraclavicular adenopathy.     Left upper body: No supraclavicular adenopathy.  Skin:    General: Skin is warm.     Findings: No erythema.  Neurological:     General: No focal deficit present.     Mental Status: He is alert and oriented to person, place, and time.     Cranial Nerves: No cranial nerve deficit.     Sensory: No sensory deficit.     Gait: Gait normal.     Deep Tendon Reflexes:     Reflex Scores:      Bicep reflexes are 2+ on the right side and 2+ on the left side.      Patellar reflexes are 2+ on the right side and 2+ on the left side.    Comments: Repetitive facial movement, which greatly improved and sometiems not present when distracted.  Psychiatric:        Mood and Affect: Mood and affect normal.   ASSESSMENT AND PLAN:  Mr. Idrissa was seen today for annual exam.  Diagnoses and all orders for this visit:  Orders Placed This  Encounter  Procedures   CBC   Lipid panel   Routine general medical examination at a health care facility Assessment & Plan: We discussed the importance of regular physical activity and healthy diet for prevention of chronic illness and/or complications. Preventive guidelines  reviewed. Vaccination up to date. Next CPE in a year.   Hyperlipidemia, unspecified hyperlipidemia type Assessment & Plan: Non pharmacologic treatment to continue for now. Further recommendations will be given according to 10 years CVD risk score and lipid panel numbers.  Orders: -     Lipid panel; Future  Abnormal CBC -     CBC; Future  Return in 6 months (on 03/23/2023) for chronic problems.  Burnham Trost G. Martinique, MD  [facility]. [place] office.

## 2022-09-21 DIAGNOSIS — G249 Dystonia, unspecified: Secondary | ICD-10-CM | POA: Diagnosis not present

## 2022-09-22 ENCOUNTER — Ambulatory Visit (INDEPENDENT_AMBULATORY_CARE_PROVIDER_SITE_OTHER): Payer: BC Managed Care – PPO | Admitting: Family Medicine

## 2022-09-22 ENCOUNTER — Encounter: Payer: Self-pay | Admitting: Family Medicine

## 2022-09-22 VITALS — BP 120/70 | HR 95 | Temp 98.5°F | Resp 12 | Ht 69.5 in | Wt 194.1 lb

## 2022-09-22 DIAGNOSIS — Z130 Encounter for screening for diseases of the blood and blood-forming organs and certain disorders involving the immune mechanism: Secondary | ICD-10-CM

## 2022-09-22 DIAGNOSIS — R7989 Other specified abnormal findings of blood chemistry: Secondary | ICD-10-CM

## 2022-09-22 DIAGNOSIS — E785 Hyperlipidemia, unspecified: Secondary | ICD-10-CM

## 2022-09-22 DIAGNOSIS — N401 Enlarged prostate with lower urinary tract symptoms: Secondary | ICD-10-CM

## 2022-09-22 DIAGNOSIS — Z0000 Encounter for general adult medical examination without abnormal findings: Secondary | ICD-10-CM | POA: Diagnosis not present

## 2022-09-22 LAB — LIPID PANEL
Cholesterol: 167 mg/dL (ref 0–200)
HDL: 39.2 mg/dL (ref >39.00)
LDL Cholesterol: 114 mg/dL — ABNORMAL HIGH (ref 0–99)
NonHDL: 127.49
Total CHOL/HDL Ratio: 4
Triglycerides: 68 mg/dL (ref 0.0–149.0)
VLDL: 13.6 mg/dL (ref 0.0–40.0)

## 2022-09-22 LAB — CBC
HCT: 37.8 % — ABNORMAL LOW (ref 39.0–52.0)
Hemoglobin: 12.8 g/dL — ABNORMAL LOW (ref 13.0–17.0)
MCHC: 34 g/dL (ref 30.0–36.0)
MCV: 90.2 fl (ref 78.0–100.0)
Platelets: 463 10*3/uL — ABNORMAL HIGH (ref 150.0–400.0)
RBC: 4.2 Mil/uL — ABNORMAL LOW (ref 4.22–5.81)
RDW: 13.7 % (ref 11.5–15.5)
WBC: 6 10*3/uL (ref 4.0–10.5)

## 2022-09-22 NOTE — Assessment & Plan Note (Signed)
We discussed the importance of regular physical activity and healthy diet for prevention of chronic illness and/or complications. Preventive guidelines reviewed. Vaccination up to date. Next CPE in a year.

## 2022-09-22 NOTE — Patient Instructions (Addendum)
A few things to remember from today's visit:  Routine general medical examination at a health care facility  Screening for endocrine, metabolic and immunity disorder  BPH associated with nocturia  Hyperlipidemia, unspecified hyperlipidemia type - Plan: Lipid panel  Abnormal CBC - Plan: CBC  If you need refills for medications you take chronically, please call your pharmacy. Do not use My Chart to request refills or for acute issues that need immediate attention. If you send a my chart message, it may take a few days to be addressed, specially if I am not in the office.  Please be sure medication list is accurate. If a new problem present, please set up appointment sooner than planned today.  Health Maintenance, Male Adopting a healthy lifestyle and getting preventive care are important in promoting health and wellness. Ask your health care provider about: The right schedule for you to have regular tests and exams. Things you can do on your own to prevent diseases and keep yourself healthy. What should I know about diet, weight, and exercise? Eat a healthy diet  Eat a diet that includes plenty of vegetables, fruits, low-fat dairy products, and lean protein. Do not eat a lot of foods that are high in solid fats, added sugars, or sodium. Maintain a healthy weight Body mass index (BMI) is a measurement that can be used to identify possible weight problems. It estimates body fat based on height and weight. Your health care provider can help determine your BMI and help you achieve or maintain a healthy weight. Get regular exercise Get regular exercise. This is one of the most important things you can do for your health. Most adults should: Exercise for at least 150 minutes each week. The exercise should increase your heart rate and make you sweat (moderate-intensity exercise). Do strengthening exercises at least twice a week. This is in addition to the moderate-intensity exercise. Spend  less time sitting. Even light physical activity can be beneficial. Watch cholesterol and blood lipids Have your blood tested for lipids and cholesterol at 50 years of age, then have this test every 5 years. You may need to have your cholesterol levels checked more often if: Your lipid or cholesterol levels are high. You are older than 50 years of age. You are at high risk for heart disease. What should I know about cancer screening? Many types of cancers can be detected early and may often be prevented. Depending on your health history and family history, you may need to have cancer screening at various ages. This may include screening for: Colorectal cancer. Prostate cancer. Skin cancer. Lung cancer. What should I know about heart disease, diabetes, and high blood pressure? Blood pressure and heart disease High blood pressure causes heart disease and increases the risk of stroke. This is more likely to develop in people who have high blood pressure readings or are overweight. Talk with your health care provider about your target blood pressure readings. Have your blood pressure checked: Every 3-5 years if you are 58-29 years of age. Every year if you are 48 years old or older. If you are between the ages of 59 and 58 and are a current or former smoker, ask your health care provider if you should have a one-time screening for abdominal aortic aneurysm (AAA). Diabetes Have regular diabetes screenings. This checks your fasting blood sugar level. Have the screening done: Once every three years after age 35 if you are at a normal weight and have a low risk for  diabetes. More often and at a younger age if you are overweight or have a high risk for diabetes. What should I know about preventing infection? Hepatitis B If you have a higher risk for hepatitis B, you should be screened for this virus. Talk with your health care provider to find out if you are at risk for hepatitis B  infection. Hepatitis C Blood testing is recommended for: Everyone born from [birth year] through 1963. Anyone with known risk factors for hepatitis C. Sexually transmitted infections (STIs) You should be screened each year for STIs, including gonorrhea and chlamydia, if: You are sexually active and are younger than 50 years of age. You are older than 50 years of age and your health care provider tells you that you are at risk for this type of infection. Your sexual activity has changed since you were last screened, and you are at increased risk for chlamydia or gonorrhea. Ask your health care provider if you are at risk. Ask your health care provider about whether you are at high risk for HIV. Your health care provider may recommend a prescription medicine to help prevent HIV infection. If you choose to take medicine to prevent HIV, you should first get tested for HIV. You should then be tested every 3 months for as long as you are taking the medicine. Follow these instructions at home: Alcohol use Do not drink alcohol if your health care provider tells you not to drink. If you drink alcohol: Limit how much you have to 0-2 drinks a day. Know how much alcohol is in your drink. In the [place] one drink equals one 12 oz bottle of beer (355 mL), one 5 oz glass of wine (148 mL), or one 1 oz glass of hard liquor (44 mL). Lifestyle Do not use any products that contain nicotine or tobacco. These products include cigarettes, chewing tobacco, and vaping devices, such as e-cigarettes. If you need help quitting, ask your health care provider. Do not use street drugs. Do not share needles. Ask your health care provider for help if you need support or information about quitting drugs. General instructions Schedule regular health, dental, and eye exams. Stay current with your vaccines. Tell your health care provider if: You often feel depressed. You have ever been abused or do not feel safe at  home. Summary Adopting a healthy lifestyle and getting preventive care are important in promoting health and wellness. Follow your health care provider's instructions about healthy diet, exercising, and getting tested or screened for diseases. Follow your health care provider's instructions on monitoring your cholesterol and blood pressure. This information is not intended to replace advice given to you by your health care provider. Make sure you discuss any questions you have with your health care provider. Document Revised: 12/13/2020 Document Reviewed: 12/13/2020 Elsevier Patient Education  [place].

## 2022-09-22 NOTE — Assessment & Plan Note (Signed)
Non pharmacologic treatment to continue for now. Further recommendations will be given according to 10 years CVD risk score and lipid panel numbers.  

## 2022-09-25 ENCOUNTER — Other Ambulatory Visit: Payer: Self-pay | Admitting: Urology

## 2022-10-03 DIAGNOSIS — B952 Enterococcus as the cause of diseases classified elsewhere: Secondary | ICD-10-CM | POA: Diagnosis not present

## 2022-10-03 DIAGNOSIS — N390 Urinary tract infection, site not specified: Secondary | ICD-10-CM | POA: Diagnosis not present

## 2022-10-13 ENCOUNTER — Other Ambulatory Visit: Payer: Self-pay

## 2022-10-13 ENCOUNTER — Encounter (HOSPITAL_BASED_OUTPATIENT_CLINIC_OR_DEPARTMENT_OTHER): Payer: Self-pay | Admitting: Urology

## 2022-10-13 NOTE — Progress Notes (Signed)
Spoke w/ via phone for pre-op interview---pt Lab needs dos----none               Lab results------EKG in epic 06/07/2022 COVID test -----patient states asymptomatic no test needed Arrive at -------0800 NPO after MN NO Solid Food.  Clear liquids from MN until---0700 Med rec completed Medications to take morning of surgery ---valium , celexa, and flomax Diabetic medication -----n/a Patient instructed no nail polish to be worn day of surgery Patient instructed to bring photo id and insurance card day of surgery Patient aware to have Driver (ride ) / wife Jakaree Damasco caregiver    for 24 hours after surgery  Patient Special Instructions -----n/a Pre-Op special Istructions -----n/a Patient verbalized understanding of instructions that were given at this phone interview. Patient denies shortness of breath, chest pain, fever, cough at this phone interview.

## 2022-10-15 NOTE — H&P (Signed)
I was consulted to assess the patient's urinary incontinence and microscopic hematuria. In the last few months he was told he had microscopic hematuria. Does not take daily aspirin or blood thinners and does not smoke no gross hematuria   In the last 2 to 3 months he has had bedwetting mild to moderate 5 out of 7 days a week. 1 month ago he had a high-volume episode. During the day he has urge incontinence and wears 2 or 3 pads a day that are damp. No stress incontinence   He voids every 2-3 hours gets up 3-4 times a night   For 2 to 3 years or longer he has had a slow flow. He sits to urinate. He strains most times. If he does not strain he voids a small amount. He can stop and start. Sometimes he does not feel empty. He is on Flomax 0.4 mg and it helps some.   His neurologist at [facility] thinks he has developed jaw spasms secondary to a foreign body reaction from wearing a mask during [place]. He has no neurologic complaints from the neck down. No local neurologic symptoms. He takes treatment including Botox for it   Male genitalia normal. Normal perineal sensation. He had mild stenosis in the rectal area causing discomfort with digital rectal examination so I stopped.   Urine positive for blood and bacteria and sent for culture. Chart reviewed. Bladder scan 549 mL   Patient has significant flow symptoms especially for his age. He has blood in the urine although he may have a bladder infection. Having said that I will order a CT scan hematuria. I will increase Flomax to .8 mg and have him come back and check another residual. I thought it was reasonable today based on the urine to give him ciprofloxacin 250 mg twice a day for 7 days. Eventually I will order a PSA since I was unable to do a digital rectal examination but I want to measure in the face of a possible bladder infection. I will perform cystoscopy next visit. Unless he has a stricture he would need urodynamics. Other than the above story he does  not have other neurologic issues from the neck down.   I decided to get the PSA since that was getting lab test for his renal function today. If his PSA is elevated I will repeat it in the future. The patient was teasing me about her dinner and a movie with always has some doing   TOday  Frequency stable. PSA was 1.71. He had a CT scan with a mild spleen abnormality that was cleared on MRI. On both x-rays he had a thickened bladder in keeping with neurogenic bladder and or bladder outlet obstruction. Once again no neurologic symptoms from the neck down. Was diagnosed in 2020.  His bladder symptoms date back approximately 1 year   He was bladder scanned today for 846 mL   Patient underwent flexible cystoscopy. The penile bulbar urethra distally looked normal. In the proximal bulbar urethra he had approximately an 8 French stricture. I could not see beyond the stricture. The urothelium looked healthy distally   Picture was drawn. Patient understands he could have a thickened bladder due to an otherwise asymptomatic chronic urethral stricture. I went over cystoscopy retrograde urethrogram and balloon dilatation with a new balloon in detail. He understands that theoretically this could help his ability to empty. His residual still could be elevated but he is young to have benign prostatic hyperplasia. His urge  incontinence could improve stay the same or worsen and this was discussed. Recognizing its distal to the sphincter it would be uncommon to worsen his stricture. Based on complexity of problem he understands stricture might not be the underlying problem and we would need to order urodynamics in the future. Injury and sequelae discussed. Recurrence rate discussed.   I discussed the workup of his hematuria again. He did not have blood in the urine today. He was also consented for bladder biopsy and/or transurethral section of the prostate with complications and sequelae if needed. I would asked one of  my partners to join me if he did have an unsuspected tumor in his bladder which is unlikely. Bladder perforation and sequelae discussed   I think he is very well-educated on the issue and he knows that I am trying to sort out prostate obstruction for young man versus stricture causing chronic obstruction versus neurogenic bladder. He understands catheter will be left in as per protocol for the new balloon dilation catheter for a few days afterwards. We can always put him off work for a few days if needed   Today  Flow stable. Frequency stable  Patient and his wife came in and I went through the entire situation with some covering slow flow thickened bladder bedwetting urodynamics possibly as well as other issues. I went over the usual postoperative course and procedure again. I answered many questions. I think there are pleaser discussions. I will see him on Tuesday.     ALLERGIES: Amoxicillin Cillin Family  Penicillin    MEDICATIONS: Flomax 0.4 mg capsule 2 capsule PO Daily  Trihexyphenidyl Hcl 2 mg tablet 1 tablet PO Daily     GU PSH: Cystoscopy - 08/10/2022 Locm 300-'399Mg'$ /Ml Iodine,1Ml - 07/03/2022     NON-GU PSH: Repair Achilles Tendon - about 2001     GU PMH: Bulbar urethral stricture - 08/10/2022 Incomplete bladder emptying - 08/10/2022, - 05/23/2022 Microscopic hematuria - 07/03/2022, - 05/23/2022 Nocturnal Enuresis - 05/23/2022 Urge incontinence - 05/23/2022    NON-GU PMH: Asthma    FAMILY HISTORY: 1 - Son, Daughter Deceased - Father Kidney Stones - Mother Prostate Cancer - Father   SOCIAL HISTORY: Marital Status: Married Preferred Language: English; Ethnicity: Not Hispanic Or Latino; Race: Black or African American Current Smoking Status: Patient has never smoked.   Tobacco Use Assessment Completed: Used Tobacco in last 30 days? Does not use smokeless tobacco. Drinks 2 drinks per week.  Drinks 1 caffeinated drink per day. Patient's occupation [employer].    REVIEW OF SYSTEMS:    GU Review Male:   Patient reports frequent urination, hard to postpone urination, get up at night to urinate, leakage of urine, trouble starting your stream, and have to strain to urinate . Patient denies burning/ pain with urination, stream starts and stops, erection problems, and penile pain.  Gastrointestinal (Upper):   Patient denies nausea, indigestion/ heartburn, and vomiting.  Gastrointestinal (Lower):   Patient denies diarrhea and constipation.  Constitutional:   Patient reports night sweats. Patient denies fever, weight loss, and fatigue.  Skin:   Patient denies skin rash/ lesion and itching.  Eyes:   Patient denies blurred vision and double vision.  Ears/ Nose/ Throat:   Patient denies sore throat and sinus problems.  Hematologic/Lymphatic:   Patient denies swollen glands and easy bruising.  Cardiovascular:   Patient denies leg swelling and chest pains.  Respiratory:   Patient denies cough and shortness of breath.  Endocrine:   Patient  denies excessive thirst.  Musculoskeletal:   Patient denies back pain and joint pain.  Neurological:   Patient denies headaches and dizziness.  Psychologic:   Patient denies depression and anxiety.   VITAL SIGNS: None   Complexity of Data:   05/23/22  PSA  Total PSA 1.71 ng/mL    PROCEDURES:          Urinalysis w/Scope Dipstick Dipstick Cont'd Micro  Color: Amber Bilirubin: Neg mg/dL WBC/hpf: 10 - 20/hpf  Appearance: Cloudy Ketones: Neg mg/dL RBC/hpf: NS (Not Seen)  Specific Gravity: 1.015 Blood: Neg ery/uL Bacteria: Many (>50/hpf)  pH: 6.0 Protein: Neg mg/dL Cystals: Amorph Urates  Glucose: Neg mg/dL Urobilinogen: 0.2 mg/dL Casts: NS (Not Seen)    Nitrites: Neg Trichomonas: Not Present    Leukocyte Esterase: 3+ leu/uL Mucous: Not Present      Epithelial Cells: 6 - 10/hpf      Yeast: NS (Not Seen)      Sperm: Not Present    Notes: many transitional epithellium seen    ASSESSMENT:      ICD-10  Details  1 GU:   Bulbar urethral stricture - N35.011   2   Urge incontinence - N39.41      PLAN:           Orders Labs Urine Culture          Schedule Return Visit/Planned Activity: Keep Scheduled Appointment - Office Visit          Document Letter(s):  Created for Patient: Clinical Summary         Next Appointment:      Next Appointment: 09/12/2022 08:30 AM    Appointment Type: Surgery     Location: [facility] [phone]    Provider: Bjorn Loser, M.D.    Reason for Visit: OP NE CYSTO DALLOON DIL RTG UGRAM POSS BLD BX

## 2022-10-17 ENCOUNTER — Encounter (HOSPITAL_BASED_OUTPATIENT_CLINIC_OR_DEPARTMENT_OTHER)
Admission: RE | Disposition: A | Discharge status: Home / Self Care | Payer: Self-pay | Source: Home / Self Care | Attending: Urology

## 2022-10-17 ENCOUNTER — Ambulatory Visit (HOSPITAL_BASED_OUTPATIENT_CLINIC_OR_DEPARTMENT_OTHER): Payer: BC Managed Care – PPO | Admitting: Anesthesiology

## 2022-10-17 ENCOUNTER — Ambulatory Visit (HOSPITAL_BASED_OUTPATIENT_CLINIC_OR_DEPARTMENT_OTHER)
Admission: RE | Admit: 2022-10-17 | Discharge: 2022-10-17 | Disposition: A | Discharge status: Home / Self Care | Payer: BC Managed Care – PPO | Attending: Urology | Admitting: Urology

## 2022-10-17 ENCOUNTER — Encounter (HOSPITAL_BASED_OUTPATIENT_CLINIC_OR_DEPARTMENT_OTHER): Payer: Self-pay | Admitting: Urology

## 2022-10-17 ENCOUNTER — Other Ambulatory Visit: Payer: Self-pay

## 2022-10-17 DIAGNOSIS — N35912 Unspecified bulbous urethral stricture, male: Secondary | ICD-10-CM | POA: Diagnosis not present

## 2022-10-17 DIAGNOSIS — N3941 Urge incontinence: Secondary | ICD-10-CM | POA: Diagnosis not present

## 2022-10-17 DIAGNOSIS — N35919 Unspecified urethral stricture, male, unspecified site: Secondary | ICD-10-CM | POA: Diagnosis not present

## 2022-10-17 DIAGNOSIS — R339 Retention of urine, unspecified: Secondary | ICD-10-CM | POA: Diagnosis not present

## 2022-10-17 DIAGNOSIS — R7303 Prediabetes: Secondary | ICD-10-CM

## 2022-10-17 HISTORY — PX: CYSTOSCOPY WITH URETHRAL DILATATION: SHX5125

## 2022-10-17 SURGERY — CYSTOSCOPY, WITH URETHRAL DILATION
Anesthesia: General | Site: Ureter

## 2022-10-17 MED ORDER — ACETAMINOPHEN 500 MG PO TABS
ORAL_TABLET | ORAL | Status: AC
  Filled 2022-10-17: qty 2

## 2022-10-17 MED ORDER — OXYCODONE HCL 5 MG PO TABS
ORAL_TABLET | ORAL | Status: AC
  Filled 2022-10-17: qty 1

## 2022-10-17 MED ORDER — ACETAMINOPHEN 500 MG PO TABS
1000.0000 mg | ORAL_TABLET | Freq: Once | ORAL | Status: AC
  Administered 2022-10-17: 1000 mg via ORAL

## 2022-10-17 MED ORDER — AMISULPRIDE (ANTIEMETIC) 5 MG/2ML IV SOLN: 10.0000 mg | Freq: Once | INTRAVENOUS | Status: DC | PRN

## 2022-10-17 MED ORDER — CIPROFLOXACIN IN D5W 400 MG/200ML IV SOLN
400.0000 mg | INTRAVENOUS | Status: AC
  Administered 2022-10-17: 400 mg via INTRAVENOUS

## 2022-10-17 MED ORDER — ONDANSETRON HCL 4 MG/2ML IJ SOLN
INTRAMUSCULAR | Status: DC | PRN
  Administered 2022-10-17: 4 mg via INTRAVENOUS

## 2022-10-17 MED ORDER — CIPROFLOXACIN IN D5W 400 MG/200ML IV SOLN
INTRAVENOUS | Status: AC
  Filled 2022-10-17: qty 200

## 2022-10-17 MED ORDER — OXYCODONE HCL 5 MG PO TABS
5.0000 mg | ORAL_TABLET | Freq: Once | ORAL | Status: AC | PRN
  Administered 2022-10-17: 5 mg via ORAL

## 2022-10-17 MED ORDER — ONDANSETRON HCL 4 MG/2ML IJ SOLN
INTRAMUSCULAR | Status: AC
  Filled 2022-10-17: qty 2

## 2022-10-17 MED ORDER — OXYCODONE HCL 5 MG/5ML PO SOLN: 5.0000 mg | Freq: Once | ORAL | Status: AC | PRN

## 2022-10-17 MED ORDER — PROPOFOL 10 MG/ML IV BOLUS
INTRAVENOUS | Status: DC | PRN
  Administered 2022-10-17: 170 mg via INTRAVENOUS
  Administered 2022-10-17: 30 mg via INTRAVENOUS

## 2022-10-17 MED ORDER — STERILE WATER FOR IRRIGATION IR SOLN
Status: DC | PRN
  Administered 2022-10-17: 3000 mL
  Administered 2022-10-17: 500 mL
  Administered 2022-10-17: 3000 mL

## 2022-10-17 MED ORDER — LIDOCAINE HCL (PF) 2 % IJ SOLN
INTRAMUSCULAR | Status: AC
  Filled 2022-10-17: qty 5

## 2022-10-17 MED ORDER — DEXAMETHASONE SODIUM PHOSPHATE 10 MG/ML IJ SOLN
INTRAMUSCULAR | Status: AC
  Filled 2022-10-17: qty 1

## 2022-10-17 MED ORDER — PROPOFOL 10 MG/ML IV BOLUS
INTRAVENOUS | Status: AC
  Filled 2022-10-17: qty 20

## 2022-10-17 MED ORDER — LIDOCAINE 2% (20 MG/ML) 5 ML SYRINGE
INTRAMUSCULAR | Status: DC | PRN
  Administered 2022-10-17: 100 mg via INTRAVENOUS

## 2022-10-17 MED ORDER — MIDAZOLAM HCL 2 MG/2ML IJ SOLN
INTRAMUSCULAR | Status: AC
  Filled 2022-10-17: qty 2

## 2022-10-17 MED ORDER — MIDAZOLAM HCL 5 MG/5ML IJ SOLN
INTRAMUSCULAR | Status: DC | PRN
  Administered 2022-10-17: 2 mg via INTRAVENOUS

## 2022-10-17 MED ORDER — DEXAMETHASONE SODIUM PHOSPHATE 10 MG/ML IJ SOLN
INTRAMUSCULAR | Status: DC | PRN
  Administered 2022-10-17: 5 mg via INTRAVENOUS

## 2022-10-17 MED ORDER — LACTATED RINGERS IV SOLN: INTRAVENOUS | Status: DC

## 2022-10-17 MED ORDER — FENTANYL CITRATE (PF) 100 MCG/2ML IJ SOLN
INTRAMUSCULAR | Status: AC
  Filled 2022-10-17: qty 2

## 2022-10-17 MED ORDER — HYDROCODONE-ACETAMINOPHEN 5-325 MG PO TABS: 1.0000 | ORAL_TABLET | Freq: Four times a day (QID) | ORAL | 0 refills | Status: AC | PRN

## 2022-10-17 MED ORDER — IOHEXOL 300 MG/ML  SOLN
INTRAMUSCULAR | Status: DC | PRN
  Administered 2022-10-17: 50 mL via URETHRAL

## 2022-10-17 MED ORDER — FENTANYL CITRATE (PF) 100 MCG/2ML IJ SOLN
INTRAMUSCULAR | Status: DC | PRN
  Administered 2022-10-17: 25 ug via INTRAVENOUS
  Administered 2022-10-17: 50 ug via INTRAVENOUS

## 2022-10-17 MED ORDER — PHENYLEPHRINE 80 MCG/ML (10ML) SYRINGE FOR IV PUSH (FOR BLOOD PRESSURE SUPPORT)
PREFILLED_SYRINGE | INTRAVENOUS | Status: DC | PRN
  Administered 2022-10-17 (×3): 80 ug via INTRAVENOUS

## 2022-10-17 MED ORDER — FENTANYL CITRATE (PF) 100 MCG/2ML IJ SOLN: 25.0000 ug | INTRAMUSCULAR | Status: DC | PRN

## 2022-10-17 MED ORDER — PHENYLEPHRINE 80 MCG/ML (10ML) SYRINGE FOR IV PUSH (FOR BLOOD PRESSURE SUPPORT)
PREFILLED_SYRINGE | INTRAVENOUS | Status: AC
  Filled 2022-10-17: qty 10

## 2022-10-17 SURGICAL SUPPLY — 34 items
BAG DRAIN URO-CYSTO SKYTR STRL (DRAIN) ×1 IMPLANT
BAG DRN RND TRDRP ANRFLXCHMBR (UROLOGICAL SUPPLIES) ×1
BAG DRN UROCATH (DRAIN) ×1
BAG URINE DRAIN 2000ML AR STRL (UROLOGICAL SUPPLIES) IMPLANT
BALLN NEPHROSTOMY (BALLOONS) ×1
BALLN OPTILUME DCB 30X3X75 (BALLOONS)
BALLN OPTILUME DCB 30X5X75 (BALLOONS)
BALLOON NEPHROSTOMY (BALLOONS) IMPLANT
BALLOON OPTILUME DCB 30X3X75 (BALLOONS) IMPLANT
BALLOON OPTILUME DCB 30X5X75 (BALLOONS) IMPLANT
CATH FOLEY 2W COUNCIL 5CC 16FR (CATHETERS) IMPLANT
CATH ROBINSON RED A/P 14FR (CATHETERS) ×1 IMPLANT
CATH SET URETHRAL DILATOR (CATHETERS) IMPLANT
CATH URET 5FR 28IN CONE TIP (BALLOONS)
CATH URET 5FR 70CM CONE TIP (BALLOONS) IMPLANT
CATH URETL OPEN 5X70 (CATHETERS) IMPLANT
CATH URETL OPEN END 6FR 70 (CATHETERS) IMPLANT
CLOTH BEACON ORANGE TIMEOUT ST (SAFETY) ×2 IMPLANT
GLOVE BIO SURGEON STRL SZ7.5 (GLOVE) ×1 IMPLANT
GLOVE BIOGEL PI IND STRL 6.5 (GLOVE) IMPLANT
GOWN STRL REUS W/TWL LRG LVL3 (GOWN DISPOSABLE) ×2 IMPLANT
GUIDEWIRE STR DUAL SENSOR (WIRE) ×1 IMPLANT
HOLDER FOLEY CATH W/STRAP (MISCELLANEOUS) IMPLANT
KIT TURNOVER CYSTO (KITS) ×1 IMPLANT
MANIFOLD NEPTUNE II (INSTRUMENTS) IMPLANT
NS IRRIG 500ML POUR BTL (IV SOLUTION) IMPLANT
PACK CYSTO (CUSTOM PROCEDURE TRAY) ×1 IMPLANT
SLEEVE SCD COMPRESS KNEE MED (STOCKING) ×1 IMPLANT
SYR 30ML LL (SYRINGE) IMPLANT
SYR TOOMEY IRRIG 70ML (MISCELLANEOUS) ×1
SYRINGE TOOMEY IRRIG 70ML (MISCELLANEOUS) IMPLANT
TUBE CONNECTING 12X1/4 (SUCTIONS) IMPLANT
WATER STERILE IRR 3000ML UROMA (IV SOLUTION) ×1 IMPLANT
WATER STERILE IRR 500ML POUR (IV SOLUTION) IMPLANT

## 2022-10-17 NOTE — Discharge Instructions (Addendum)
I have reviewed discharge instructions in detail with the patient. They will follow-up with me or their physician as scheduled. My nurse will also be calling the patients as per protocol.         No acetaminophen/Tylenol until after 2:45 pm today if needed.     Activity: Get plenty of rest for the remainder of the day. A responsible individual must stay with you for 24 hours following the procedure.  For the next 24 hours, DO NOT: -Drive a car -Paediatric nurse -Drink alcoholic beverages -Take any medication unless instructed by your physician -Make any legal decisions or sign important papers.  Meals: Start with liquid foods such as gelatin or soup. Progress to regular foods as tolerated. Avoid greasy, spicy, heavy foods. If nausea and/or vomiting occur, drink only clear liquids until the nausea and/or vomiting subsides. Call your physician if vomiting continues.  Special Instructions/Symptoms: Your throat may feel dry or sore from the anesthesia or the breathing tube placed in your throat during surgery. If this causes discomfort, gargle with warm salt water. The discomfort should disappear within 24 hours.

## 2022-10-17 NOTE — Anesthesia Procedure Notes (Signed)
Procedure Name: LMA Insertion Date/Time: 10/17/2022 10:19 AM  Performed by: Rogers Blocker, CRNAPre-anesthesia Checklist: Patient identified, Emergency Drugs available, Suction available and Patient being monitored Patient Re-evaluated:Patient Re-evaluated prior to induction Oxygen Delivery Method: Circle System Utilized Preoxygenation: Pre-oxygenation with 100% oxygen Induction Type: IV induction Ventilation: Mask ventilation without difficulty LMA: LMA inserted LMA Size: 5.0 Number of attempts: 1 Airway Equipment and Method: Bite block Placement Confirmation: positive ETCO2 Tube secured with: Tape Dental Injury: Teeth and Oropharynx as per pre-operative assessment

## 2022-10-17 NOTE — Transfer of Care (Signed)
Immediate Anesthesia Transfer of Care Note  Patient: Sean Romero  Procedure(s) Performed: CYSTOSCOPY WITH BALLOON URETHRAL DILATATION RETROGRADE URETHROGRAM AND CYSTOGRAM (Ureter)  Patient Location: PACU  Anesthesia Type:General  Level of Consciousness: drowsy and responds to stimulation  Airway & Oxygen Therapy: Patient Spontanous Breathing and Patient connected to face mask oxygen  Post-op Assessment: Report given to RN and Post -op Vital signs reviewed and stable  Post vital signs: Reviewed and stable  Last Vitals:  Vitals Value Taken Time  BP 119/86 10/17/22 1122  Temp 36.1 C 10/17/22 1122  Pulse 76 10/17/22 1126  Resp 15 10/17/22 1126  SpO2 100 % 10/17/22 1126  Vitals shown include unvalidated device data.  Last Pain:  Vitals:   10/17/22 0828  TempSrc: Oral  PainSc: 0-No pain      Patients Stated Pain Goal: 5 ([id number])  Complications: No notable events documented.

## 2022-10-17 NOTE — Progress Notes (Signed)
AVS reviewed with client and significant other. Urinary drainage system changed to leg bag per MD orders. Pt teaching done re: use of leg bag, draining leg and drainage system care. Tolerated procedure well. Copy of AVS provided to family member

## 2022-10-17 NOTE — Interval H&P Note (Signed)
History and Physical Interval Note:  10/17/2022 10:11 AM  Sean Romero  has presented today for surgery, with the diagnosis of URETHRAL STRICTURE MICROHEMATURIA.  The various methods of treatment have been discussed with the patient and family. After consideration of risks, benefits and other options for treatment, the patient has consented to  Procedure(s): CYSTOSCOPY WITH BALLOON URETHRAL DILATATION RETROGRADE URETHROGRAM AND BIOPSY (N/A) as a surgical intervention.  The patient's history has been reviewed, patient examined, no change in status, stable for surgery.  I have reviewed the patient's chart and labs.  Questions were answered to the patient's satisfaction.     Sean Romero A Sean Romero

## 2022-10-17 NOTE — Anesthesia Postprocedure Evaluation (Signed)
Anesthesia Post Note  Patient: Sean Romero  Procedure(s) Performed: CYSTOSCOPY WITH BALLOON URETHRAL DILATATION RETROGRADE URETHROGRAM AND CYSTOGRAM (Ureter)     Patient location during evaluation: PACU Anesthesia Type: General Level of consciousness: awake and alert Pain management: pain level controlled Vital Signs Assessment: post-procedure vital signs reviewed and stable Respiratory status: spontaneous breathing, nonlabored ventilation and respiratory function stable Cardiovascular status: blood pressure returned to baseline Postop Assessment: no apparent nausea or vomiting Anesthetic complications: no   No notable events documented.  Last Vitals:  Vitals:   10/17/22 1145 10/17/22 1200  BP: (!) 127/100 (!) 131/99  Pulse: 63   Resp: 18   Temp:  (!) 36.2 C  SpO2: 100%     Last Pain:  Vitals:   10/17/22 1215  TempSrc:   PainSc: 2                  Marthenia Rolling

## 2022-10-17 NOTE — Anesthesia Preprocedure Evaluation (Addendum)
Anesthesia Evaluation  Patient identified by MRN, date of birth, ID band Patient awake    Reviewed: Allergy & Precautions, NPO status , Patient's Chart, lab work & pertinent test results  History of Anesthesia Complications Negative for: history of anesthetic complications  Airway Mallampati: II  TM Distance: >3 FB Neck ROM: Full    Dental  (+) Missing,    Pulmonary asthma , sleep apnea    Pulmonary exam normal        Cardiovascular negative cardio ROS Normal cardiovascular exam     Neuro/Psych   Anxiety     negative neurological ROS     GI/Hepatic negative GI ROS, Neg liver ROS,,,  Endo/Other  negative endocrine ROS    Renal/GU negative Renal ROS   URETHRAL STRICTURE MICROHEMATURIA    Musculoskeletal negative musculoskeletal ROS (+)    Abdominal   Peds  Hematology negative hematology ROS (+)   Anesthesia Other Findings Day of surgery medications reviewed with patient.  Reproductive/Obstetrics                              Anesthesia Physical Anesthesia Plan  ASA: 2  Anesthesia Plan: General   Post-op Pain Management: Tylenol PO (pre-op)*   Induction: Intravenous  PONV Risk Score and Plan: 2 and Treatment may vary due to age or medical condition, Ondansetron, Dexamethasone and Midazolam  Airway Management Planned: LMA  Additional Equipment: None  Intra-op Plan:   Post-operative Plan: Extubation in OR  Informed Consent: I have reviewed the patients History and Physical, chart, labs and discussed the procedure including the risks, benefits and alternatives for the proposed anesthesia with the patient or authorized representative who has indicated his/her understanding and acceptance.     Dental advisory given  Plan Discussed with: CRNA  Anesthesia Plan Comments:         Anesthesia Quick Evaluation

## 2022-10-17 NOTE — Op Note (Signed)
Preoperative diagnosis: Urethral stricture and incomplete bladder emptying Postoperative diagnosis: Urethral stricture and incomplete bladder emptying Surgery: Cystoscopy retrograde urethrogram; cystogram; balloon dilation of stricture and insertion of Foley catheter Surgeon: Dr. Nicki Reaper Micheal Sheen  The patient has the above diagnoses and consented the above procedure.  Clinically was not infected on suppressive therapy  I initially cystoscope the patient with a 24 [place] scope.  He appeared to have a proximal bulbar stricture of approximately 6 [place] almost closed.  I did a retrograde urethrogram with approximately 10 cc of contrast twice.  Based upon his body habitus and position of the C arm he he appeared to have a longer stricture in the proximal bulbar urethra and I could see a little bit of contrast in the bladder.  There was little bit of contrast in the prostatic urethra  Under fluoroscopic and cystoscopic guidance I could pass a Glidewire curling nicely in the bladder.Over the [facility] under fluoroscopic guidance I tried to pass the balloon dilation catheter well-prepared and would not go past the distal edge of the stricture.  I repeated the urethrogram to check the anatomy.  I tried multiple times with gentle traction with good traction on the sensor wire and the dilation catheter would not advance safely to the bladder.  At this point I passed a 6 [place] open-ended ureteral catheter curling nicely in the bladder removing the sensor wire.  I used approxi-100 cc of contrast mixed with sterile water to fill his already distended bladder.  He had no bladder injury.  His bladder was very large and his bladder neck was shot.  I reviewed I reinserted the sensor wire and remove the open-ended ureteral catheter.  Several times I tried to pass the balloon dilation catheter and could not.  I cystoscope the patient and at this stage the distal end of the stricture was approximately 8 or 9 [place].  I  could not scope beyond this.  It did not look like any false passage.  Before looking to use ureteroscopy I then used a well-lubricated 8 French renal dilators.  It went in very nicely.  The 10 [place] would not go in past the distal third of the stricture.  The 8 went in again but the 10 would not.  I repeated this a couple of times and let the 8 sit for a few minutes.  Then the balloon dilation catheter did go into the bladder with the markers noted.  It was in good position and I dilated for 5 minutes 24 French 16 atm of pressure.  I cystoscope along the balloon dilation catheter with a 22 [place] scope and I felt the balloon may have just been proximal to the distal edge of the stricture.  For this reason I deflated the balloon after 5 minutes and drew it back approximately 5 cm and dilated again for 5 minutes.  I then remove the dilation catheter after emptying  I then cystoscope with a 17 [place] scope.  He had a rigid stricture in the proximal bulbar urethra.  I was able to enter the bladder.  Bladder mucosa and trigone were normal.  No carcinoma.  No obvious cystitis.  With the penis on good stretch he had a shorter prostatic urethra.  Verumontanum was intact.  Stricture started to half centimeter distal to the sphincter and extended for approximately 4 cm.  It was approximately 20 [place] or larger in size.  There is no perforation.  The tissue was moderately rigid  A 16 [place] council  tip catheter easily was placed into the bladder and the bladder was emptied.  Patient has severe urethral stricture disease.  Likely this was chronic to cause his incomplete bladder emptying and thickened bladder.  We will proceed accordingly.

## 2022-10-19 ENCOUNTER — Encounter (HOSPITAL_BASED_OUTPATIENT_CLINIC_OR_DEPARTMENT_OTHER): Payer: Self-pay | Admitting: Urology

## 2022-10-23 DIAGNOSIS — G244 Idiopathic orofacial dystonia: Secondary | ICD-10-CM | POA: Diagnosis not present

## 2022-10-23 DIAGNOSIS — N35011 Post-traumatic bulbous urethral stricture: Secondary | ICD-10-CM | POA: Diagnosis not present

## 2022-10-23 DIAGNOSIS — S134XXD Sprain of ligaments of cervical spine, subsequent encounter: Secondary | ICD-10-CM | POA: Diagnosis not present

## 2022-10-25 DIAGNOSIS — Z7951 Long term (current) use of inhaled steroids: Secondary | ICD-10-CM | POA: Diagnosis not present

## 2022-10-25 DIAGNOSIS — M2651 Abnormal jaw closure: Secondary | ICD-10-CM | POA: Diagnosis not present

## 2022-10-25 DIAGNOSIS — G245 Blepharospasm: Secondary | ICD-10-CM | POA: Diagnosis not present

## 2022-10-25 DIAGNOSIS — G248 Other dystonia: Secondary | ICD-10-CM | POA: Diagnosis not present

## 2022-10-25 DIAGNOSIS — G244 Idiopathic orofacial dystonia: Secondary | ICD-10-CM | POA: Diagnosis not present

## 2022-10-25 DIAGNOSIS — Z880 Allergy status to penicillin: Secondary | ICD-10-CM | POA: Diagnosis not present

## 2022-10-25 DIAGNOSIS — J45909 Unspecified asthma, uncomplicated: Secondary | ICD-10-CM | POA: Diagnosis not present

## 2022-11-09 DIAGNOSIS — N35011 Post-traumatic bulbous urethral stricture: Secondary | ICD-10-CM | POA: Diagnosis not present

## 2022-11-09 DIAGNOSIS — R3914 Feeling of incomplete bladder emptying: Secondary | ICD-10-CM | POA: Diagnosis not present

## 2022-11-11 ENCOUNTER — Other Ambulatory Visit: Payer: Self-pay | Admitting: Family Medicine

## 2022-11-11 DIAGNOSIS — N401 Enlarged prostate with lower urinary tract symptoms: Secondary | ICD-10-CM

## 2022-11-22 DIAGNOSIS — G249 Dystonia, unspecified: Secondary | ICD-10-CM | POA: Diagnosis not present

## 2022-12-13 DIAGNOSIS — M2651 Abnormal jaw closure: Secondary | ICD-10-CM | POA: Diagnosis not present

## 2022-12-13 DIAGNOSIS — G249 Dystonia, unspecified: Secondary | ICD-10-CM | POA: Diagnosis not present

## 2023-01-17 DIAGNOSIS — R3914 Feeling of incomplete bladder emptying: Secondary | ICD-10-CM | POA: Diagnosis not present

## 2023-01-17 DIAGNOSIS — N35011 Post-traumatic bulbous urethral stricture: Secondary | ICD-10-CM | POA: Diagnosis not present

## 2023-01-17 DIAGNOSIS — N3941 Urge incontinence: Secondary | ICD-10-CM | POA: Diagnosis not present

## 2023-02-21 DIAGNOSIS — G244 Idiopathic orofacial dystonia: Secondary | ICD-10-CM | POA: Diagnosis not present

## 2023-02-21 DIAGNOSIS — Z79899 Other long term (current) drug therapy: Secondary | ICD-10-CM | POA: Diagnosis not present

## 2023-02-21 DIAGNOSIS — G249 Dystonia, unspecified: Secondary | ICD-10-CM | POA: Diagnosis not present

## 2023-02-21 DIAGNOSIS — G245 Blepharospasm: Secondary | ICD-10-CM | POA: Diagnosis not present

## 2023-05-16 DIAGNOSIS — X58XXXA Exposure to other specified factors, initial encounter: Secondary | ICD-10-CM | POA: Diagnosis not present

## 2023-05-16 DIAGNOSIS — G249 Dystonia, unspecified: Secondary | ICD-10-CM | POA: Diagnosis not present

## 2023-05-16 DIAGNOSIS — G244 Idiopathic orofacial dystonia: Secondary | ICD-10-CM | POA: Diagnosis not present

## 2023-05-16 DIAGNOSIS — M2651 Abnormal jaw closure: Secondary | ICD-10-CM | POA: Diagnosis not present

## 2023-07-19 DIAGNOSIS — R3914 Feeling of incomplete bladder emptying: Secondary | ICD-10-CM | POA: Diagnosis not present

## 2023-07-19 DIAGNOSIS — N3941 Urge incontinence: Secondary | ICD-10-CM | POA: Diagnosis not present

## 2023-07-19 DIAGNOSIS — N35011 Post-traumatic bulbous urethral stricture: Secondary | ICD-10-CM | POA: Diagnosis not present

## 2023-08-22 DIAGNOSIS — G245 Blepharospasm: Secondary | ICD-10-CM | POA: Diagnosis not present

## 2023-08-22 DIAGNOSIS — G249 Dystonia, unspecified: Secondary | ICD-10-CM | POA: Diagnosis not present

## 2023-08-22 DIAGNOSIS — M2651 Abnormal jaw closure: Secondary | ICD-10-CM | POA: Diagnosis not present

## 2023-08-22 DIAGNOSIS — G244 Idiopathic orofacial dystonia: Secondary | ICD-10-CM | POA: Diagnosis not present

## 2023-10-03 ENCOUNTER — Ambulatory Visit: Payer: BC Managed Care – PPO | Admitting: Family Medicine

## 2023-10-03 ENCOUNTER — Encounter: Payer: Self-pay | Admitting: Family Medicine

## 2023-10-03 VITALS — BP 126/80 | HR 90 | Resp 12 | Ht 69.5 in | Wt 211.4 lb

## 2023-10-03 DIAGNOSIS — Z0000 Encounter for general adult medical examination without abnormal findings: Secondary | ICD-10-CM

## 2023-10-03 DIAGNOSIS — R7303 Prediabetes: Secondary | ICD-10-CM

## 2023-10-03 DIAGNOSIS — F419 Anxiety disorder, unspecified: Secondary | ICD-10-CM

## 2023-10-03 DIAGNOSIS — D75839 Thrombocytosis, unspecified: Secondary | ICD-10-CM | POA: Diagnosis not present

## 2023-10-03 DIAGNOSIS — G5601 Carpal tunnel syndrome, right upper limb: Secondary | ICD-10-CM

## 2023-10-03 DIAGNOSIS — E785 Hyperlipidemia, unspecified: Secondary | ICD-10-CM

## 2023-10-03 DIAGNOSIS — G249 Dystonia, unspecified: Secondary | ICD-10-CM

## 2023-10-03 DIAGNOSIS — Z23 Encounter for immunization: Secondary | ICD-10-CM

## 2023-10-03 MED ORDER — DIAZEPAM 5 MG PO TABS: 5.0000 mg | ORAL_TABLET | Freq: Every day | ORAL | 2 refills | Status: AC | PRN

## 2023-10-03 MED ORDER — CITALOPRAM HYDROBROMIDE 20 MG PO TABS: 20.0000 mg | ORAL_TABLET | Freq: Every day | ORAL | 2 refills | Status: DC

## 2023-10-03 NOTE — Patient Instructions (Addendum)
 A few things to remember from today's visit:  Routine general medical examination at a health care facility  Hyperlipidemia, unspecified hyperlipidemia type - Plan: Comprehensive metabolic panel, Lipid panel  Prediabetes - Plan: Hemoglobin A1c  Anxiety disorder, unspecified type - Plan: citalopram (CELEXA) 20 MG tablet, diazepam (VALIUM) 5 MG tablet  Dystonia, unspecified - Plan: diazepam (VALIUM) 5 MG tablet  Thrombocytosis - Plan: CBC with Differential/Platelet  Resume Celexa daily and valium prn.  If you need refills for medications you take chronically, please call your pharmacy. Do not use My Chart to request refills or for acute issues that need immediate attention. If you send a my chart message, it may take a few days to be addressed, specially if I am not in the office.  Please be sure medication list is accurate. If a new problem present, please set up appointment sooner than planned today.  Health Maintenance, Male Adopting a healthy lifestyle and getting preventive care are important in promoting health and wellness. Ask your health care provider about: The right schedule for you to have regular tests and exams. Things you can do on your own to prevent diseases and keep yourself healthy. What should I know about diet, weight, and exercise? Eat a healthy diet  Eat a diet that includes plenty of vegetables, fruits, low-fat dairy products, and lean protein. Do not eat a lot of foods that are high in solid fats, added sugars, or sodium. Maintain a healthy weight Body mass index (BMI) is a measurement that can be used to identify possible weight problems. It estimates body fat based on height and weight. Your health care provider can help determine your BMI and help you achieve or maintain a healthy weight. Get regular exercise Get regular exercise. This is one of the most important things you can do for your health. Most adults should: Exercise for at least 150 minutes  each week. The exercise should increase your heart rate and make you sweat (moderate-intensity exercise). Do strengthening exercises at least twice a week. This is in addition to the moderate-intensity exercise. Spend less time sitting. Even light physical activity can be beneficial. Watch cholesterol and blood lipids Have your blood tested for lipids and cholesterol at 51 years of age, then have this test every 5 years. You may need to have your cholesterol levels checked more often if: Your lipid or cholesterol levels are high. You are older than 51 years of age. You are at high risk for heart disease. What should I know about cancer screening? Many types of cancers can be detected early and may often be prevented. Depending on your health history and family history, you may need to have cancer screening at various ages. This may include screening for: Colorectal cancer. Prostate cancer. Skin cancer. Lung cancer. What should I know about heart disease, diabetes, and high blood pressure? Blood pressure and heart disease High blood pressure causes heart disease and increases the risk of stroke. This is more likely to develop in people who have high blood pressure readings or are overweight. Talk with your health care provider about your target blood pressure readings. Have your blood pressure checked: Every 3-5 years if you are 43-17 years of age. Every year if you are 85 years old or older. If you are between the ages of 85 and 60 and are a current or former smoker, ask your health care provider if you should have a one-time screening for abdominal aortic aneurysm (AAA). Diabetes Have regular diabetes screenings.  This checks your fasting blood sugar level. Have the screening done: Once every three years after age 28 if you are at a normal weight and have a low risk for diabetes. More often and at a younger age if you are overweight or have a high risk for diabetes. What should I know  about preventing infection? Hepatitis B If you have a higher risk for hepatitis B, you should be screened for this virus. Talk with your health care provider to find out if you are at risk for hepatitis B infection. Hepatitis C Blood testing is recommended for: Everyone born from [birth year] through 1963. Anyone with known risk factors for hepatitis C. Sexually transmitted infections (STIs) You should be screened each year for STIs, including gonorrhea and chlamydia, if: You are sexually active and are younger than 51 years of age. You are older than 51 years of age and your health care provider tells you that you are at risk for this type of infection. Your sexual activity has changed since you were last screened, and you are at increased risk for chlamydia or gonorrhea. Ask your health care provider if you are at risk. Ask your health care provider about whether you are at high risk for HIV. Your health care provider may recommend a prescription medicine to help prevent HIV infection. If you choose to take medicine to prevent HIV, you should first get tested for HIV. You should then be tested every 3 months for as long as you are taking the medicine. Follow these instructions at home: Alcohol use Do not drink alcohol if your health care provider tells you not to drink. If you drink alcohol: Limit how much you have to 0-2 drinks a day. Know how much alcohol is in your drink. In the [place] one drink equals one 12 oz bottle of beer (355 mL), one 5 oz glass of wine (148 mL), or one 1 oz glass of hard liquor (44 mL). Lifestyle Do not use any products that contain nicotine or tobacco. These products include cigarettes, chewing tobacco, and vaping devices, such as e-cigarettes. If you need help quitting, ask your health care provider. Do not use street drugs. Do not share needles. Ask your health care provider for help if you need support or information about quitting drugs. General  instructions Schedule regular health, dental, and eye exams. Stay current with your vaccines. Tell your health care provider if: You often feel depressed. You have ever been abused or do not feel safe at home. Summary Adopting a healthy lifestyle and getting preventive care are important in promoting health and wellness. Follow your health care provider's instructions about healthy diet, exercising, and getting tested or screened for diseases. Follow your health care provider's instructions on monitoring your cholesterol and blood pressure. This information is not intended to replace advice given to you by your health care provider. Make sure you discuss any questions you have with your health care provider. Document Revised: 12/13/2020 Document Reviewed: 12/13/2020 Elsevier Patient Education  2024 [employer].

## 2023-10-03 NOTE — Assessment & Plan Note (Signed)
Encouraged consistency with a healthy life style for diabetes prevention.

## 2023-10-03 NOTE — Assessment & Plan Note (Signed)
Non pharmacologic treatment to continue. Further recommendations will be given according to 10 years CVD risk score and lipid panel numbers. 

## 2023-10-03 NOTE — Assessment & Plan Note (Signed)
 He has not been taking medication for a few months, he would like to resume treatment. Celexa 20 mg daily and diazepam 5 mg daily prn, he feels like both medications were helping with anxiety. Follow-up in 6 months.

## 2023-10-03 NOTE — Assessment & Plan Note (Signed)
 We discussed the importance of regular physical activity and healthy diet for prevention of chronic illness and/or complications. Preventive guidelines reviewed. Following with urologist. Vaccination updated. Next CPE in a year.

## 2023-10-03 NOTE — Progress Notes (Signed)
 HPI: [name] is a 53 y.o.male with a PMHx significant for HLD, anxiety, BPH, and dystonia, who is here today for his routine physical examination and follow up.  Last CPE: 09/22/2022.  He has since followed with neurology, ENT, and urology.   Exercise: Patient states he was exercising regularly until he moved in 07/2023 and his car had a problem. He has not been exercising since then.  Diet: He cooks at home 3-4 times per week and eats out the other times. He is eating vegetables daily.  Sleep: He sleeps about 7 hours per night. Alcohol Use: He has a glass of bourbon weekly.  Smoking: never Vision: UTD on routine vision care.  Dental: UTD on routine dental care.   Immunization History  Administered Date(s) Administered   Influenza,inj,Quad PF,6+ Mos 05/27/2013, 09/11/2016, 08/20/2018, 05/13/2019   PFIZER(Purple Top)SARS-COV-2 Vaccination 10/20/2019, 11/11/2019, 06/22/2020   PNEUMOCOCCAL CONJUGATE-20 10/03/2023   Tdap 12/16/2014, 05/13/2018   Zoster Recombinant(Shingrix) 10/03/2023   Health Maintenance  Topic Date Due   HIV Screening  Never done   COVID-19 Vaccine (4 - 2024-25 season) 10/19/2023 (Originally 04/08/2023)   INFLUENZA VACCINE  11/05/2023 (Originally 03/08/2023)   Zoster Vaccines- Shingrix (2 of 2) 11/28/2023   DTaP/Tdap/Td (3 - Td or Tdap) 05/13/2028   Colonoscopy  09/10/2030   Pneumococcal Vaccine 38-56 Years old  Completed   Hepatitis C Screening  Completed   HPV VACCINES  Aged Out   Chronic medical problems:   Anxiety:  He has been out of citalopram 20 mg and diazepam 5 mg for awhile. He would like to resume both. Medications were effective in managing anxiety.  Dystonia:  Currently on trihexyphenidyl 2 mg three times daily.  Follows with ENT and neurologist.  BPH:  He had a cystoscopy on 10/17/2022. He follows with urology every 3 months. Currently on Flomax 0.4 mg daily.   Lab Results  Component Value Date   HGBA1C 5.6 08/29/2021   Lab  Results  Component Value Date   CHOL 167 09/22/2022   HDL 39.20 09/22/2022   LDLCALC 114 (H) 09/22/2022   LDLDIRECT 138.9 05/27/2013   TRIG 68.0 09/22/2022   CHOLHDL 4 09/22/2022   Lab Results  Component Value Date   WBC 6.0 09/22/2022   HGB 12.8 (L) 09/22/2022   HCT 37.8 (L) 09/22/2022   MCV 90.2 09/22/2022   PLT 463.0 (H) 09/22/2022   Concerns today:   Right foot bunion:  He says he has right foot pain about twice per week. He says he will have a "nagging" pani in the morning that improves throughout the day.   Hand numbness:  Patient also complains of intermittent right hand numbness, tingling, and burning for about six months.  If he moves or massages his hand, it gets better, but it is starting to take longer and longer to get relief.  He had an electromyography procedure 2-3 years ago and does not want another one.  He has tried using a splint at night in the past. It helped but he stopped doing it eventually.    Review of Systems  Constitutional:  Negative for activity change, appetite change and fever.  HENT:  Negative for nosebleeds, sore throat and trouble swallowing.   Eyes:  Negative for redness and visual disturbance.  Respiratory:  Negative for cough, shortness of breath and wheezing.   Cardiovascular:  Negative for chest pain, palpitations and leg swelling.  Gastrointestinal:  Negative for abdominal pain, blood in stool, nausea and vomiting.  Endocrine: Negative for cold intolerance, heat intolerance, polydipsia, polyphagia and polyuria.  Genitourinary:  Negative for decreased urine volume, dysuria, genital sores, hematuria and testicular pain.  Musculoskeletal:  Negative for gait problem and myalgias.  Skin:  Negative for color change and rash.  Allergic/Immunologic: Positive for environmental allergies.  Neurological:  Negative for syncope, weakness and headaches.  Hematological:  Negative for adenopathy. Does not bruise/bleed easily.   Psychiatric/Behavioral:  Negative for confusion and hallucinations.   All other systems reviewed and are negative.  Current Outpatient Medications on File Prior to Visit  Medication Sig Dispense Refill   HYDROcodone-acetaminophen (NORCO) 5-325 MG tablet Take 1-2 tablets by mouth every 6 (six) hours as needed for moderate pain. 10 tablet 0   Multiple Vitamins-Minerals (CENTRUM ADULTS PO) Take by mouth daily.     tamsulosin (FLOMAX) 0.4 MG CAPS capsule TAKE 1 CAPSULE(0.4 MG) BY MOUTH DAILY 90 capsule 2   trihexyphenidyl (ARTANE) 2 MG tablet Take 2 mg by mouth 3 (three) times daily with meals. 3 times daily with meals     No current facility-administered medications on file prior to visit.   Past Medical History:  Diagnosis Date   Asthma    09-07-2022 per pt very mild,  does not have an inhaler.  last attack fall of 2023.  10/13/2022   BPH associated with nocturia    per pt  2-5 times nightly   GAD (generalized anxiety disorder)    Hematuria    Incomplete emptying of bladder    Mild obstructive sleep apnea 09/2017   study in epic 09-27-2017  (09-07-2022  per pt had used cpap for 90 days then told did need to use anymore)  recommendation's positioning, lose weight, if symptomatic oral appliance or cpap   Oromandibular dystonia    neurologist--- dr Raquel Sarna [facility])  treated w/ botox injections and PT;   per pt of upper and lower jaw due to mask during covid,  hard to chew at times- has spasms   Urethral stricture    Urge incontinence of urine    Wears glasses    Past Surgical History:  Procedure Laterality Date   ACHILLES TENDON REPAIR Left 2001   COLONOSCOPY     2023   CYSTOSCOPY WITH URETHRAL DILATATION N/A 10/17/2022   Procedure: CYSTOSCOPY WITH BALLOON URETHRAL DILATATION RETROGRADE URETHROGRAM AND CYSTOGRAM;  Surgeon: Alfredo Martinez, MD;  Location[facility];  Service: Urology;  Laterality: N/A;   Allergies  Allergen Reactions   Penicillins Rash    Family History  Problem Relation Age of Onset   Prostate cancer Father    Colon polyps Mother        TA +    Colon cancer Paternal Grandmother    Esophageal cancer Neg Hx    Rectal cancer Neg Hx    Stomach cancer Neg Hx    Social History   Socioeconomic History   Marital status: Single    Spouse name: Not on file   Number of children: Not on file   Years of education: Not on file   Highest education level: Not on file  Occupational History   Not on file  Tobacco Use   Smoking status: Never   Smokeless tobacco: Never  Vaping Use   Vaping status: Never Used  Substance and Sexual Activity   Alcohol use: Yes    Alcohol/week: 1.0 standard drink of alcohol    Types: 1 Standard drinks or equivalent per week   Drug use: Never  Sexual activity: Not on file  Other Topics Concern   Not on file  Social History Narrative   Not on file   Social Drivers of Health   Financial Resource Strain: Not on file  Food Insecurity: Not on file  Transportation Needs: Not on file  Physical Activity: Not on file  Stress: Not on file  Social Connections: Unknown (12/19/2021)   Received from [facility]   Social Network    Social Network: Not on file   Vitals:   10/03/23 1451  BP: 126/80  Pulse: 90  Resp: 12  SpO2: 98%   Body mass index is 30.77 kg/m.  Wt Readings from Last 3 Encounters:  10/03/23 211 lb 6 oz (95.9 kg)  10/17/22 193 lb 9.6 oz (87.8 kg)  09/22/22 194 lb 2 oz (88.1 kg)   Physical Exam Vitals and nursing note reviewed.  Constitutional:      General: He is not in acute distress.    Appearance: He is well-developed.  HENT:     Head: Normocephalic and atraumatic.     Right Ear: Tympanic membrane, ear canal and external ear normal.     Left Ear: Tympanic membrane, ear canal and external ear normal.     Mouth/Throat:     Mouth: Mucous membranes are moist.     Pharynx: Oropharynx is clear. Uvula midline.  Eyes:     Extraocular Movements:  Extraocular movements intact.     Conjunctiva/sclera: Conjunctivae normal.     Pupils: Pupils are equal, round, and reactive to light.  Neck:     Thyroid: No thyroid mass or thyromegaly.     Trachea: No tracheal deviation.  Cardiovascular:     Rate and Rhythm: Normal rate and regular rhythm.     Pulses:          Dorsalis pedis pulses are 2+ on the right side and 2+ on the left side.     Heart sounds: No murmur heard. Pulmonary:     Effort: Pulmonary effort is normal. No respiratory distress.     Breath sounds: Normal breath sounds.  Abdominal:     Palpations: Abdomen is soft. There is no hepatomegaly or mass.     Tenderness: There is no abdominal tenderness.  Genitourinary:    Comments: No concerns. Musculoskeletal:        General: No tenderness.     Cervical back: Normal range of motion.     Right lower leg: No edema.     Left lower leg: No edema.     Right foot: Bunion present.     Comments: No major deformities appreciated and no signs of synovitis.  Lymphadenopathy:     Cervical: No cervical adenopathy.     Upper Body:     Right upper body: No supraclavicular adenopathy.     Left upper body: No supraclavicular adenopathy.  Skin:    General: Skin is warm.     Findings: No erythema.  Neurological:     General: No focal deficit present.     Mental Status: He is alert and oriented to person, place, and time.     Cranial Nerves: No cranial nerve deficit.     Sensory: No sensory deficit.     Motor: No weakness.     Gait: Gait normal.     Deep Tendon Reflexes:     Reflex Scores:      Bicep reflexes are 2+ on the right side and 2+ on the left side.  Patellar reflexes are 2+ on the right side and 2+ on the left side.    Comments:  Repetitive facial movement, which greatly improved and sometiems not present when distracted.   Psychiatric:        Mood and Affect: Mood and affect normal.    ASSESSMENT AND PLAN:  Mr. Nazar was seen today for his routine general medical  examination and follow up.   Orders Placed This Encounter  Procedures   Pneumococcal conjugate vaccine 20-valent (Prevnar 20)   Zoster Recombinant (Shingrix )   Comprehensive metabolic panel   Hemoglobin A1c   Lipid panel   CBC with Differential/Platelet   Lab Results  Component Value Date   WBC 5.5 10/03/2023   HGB 13.9 10/03/2023   HCT 40.5 10/03/2023   MCV 93.2 10/03/2023   PLT 237.0 10/03/2023   Lab Results  Component Value Date   NA 138 10/03/2023   CL 102 10/03/2023   K 3.7 10/03/2023   CO2 30 10/03/2023   BUN 12 10/03/2023   CREATININE 0.87 10/03/2023   GFR 100.69 10/03/2023   CALCIUM 9.2 10/03/2023   ALBUMIN 4.0 10/03/2023   GLUCOSE 92 10/03/2023   Lab Results  Component Value Date   ALT 16 10/03/2023   AST 14 10/03/2023   ALKPHOS 55 10/03/2023   BILITOT 0.6 10/03/2023   Lab Results  Component Value Date   CHOL 211 (H) 10/03/2023   HDL 65.40 10/03/2023   LDLCALC 119 (H) 10/03/2023   LDLDIRECT 138.9 05/27/2013   TRIG 129.0 10/03/2023   CHOLHDL 3 10/03/2023   Lab Results  Component Value Date   HGBA1C 5.8 10/03/2023   Routine general medical examination at a health care facility Assessment & Plan: We discussed the importance of regular physical activity and healthy diet for prevention of chronic illness and/or complications. Preventive guidelines reviewed. Following with urologist. Vaccination updated. Next CPE in a year.   Hyperlipidemia, unspecified hyperlipidemia type Assessment & Plan: Non pharmacologic treatment to continue. Further recommendations will be given according to 10 years CVD risk score and lipid panel numbers.  Orders: -     Comprehensive metabolic panel; Future -     Lipid panel; Future  Prediabetes Assessment & Plan: Encouraged consistency with a healthy life style for diabetes prevention.  Orders: -     Hemoglobin A1c; Future  Anxiety disorder, unspecified type Assessment & Plan: He has not been taking  medication for a few months, he would like to resume treatment. Celexa 20 mg daily and diazepam 5 mg daily prn, he feels like both medications were helping with anxiety. Follow-up in 6 months.  Orders: -     Citalopram Hydrobromide; Take 1 tablet (20 mg total) by mouth daily.  Dispense: 90 tablet; Refill: 2 -     diazePAM; Take 1 tablet (5 mg total) by mouth daily as needed for anxiety (No more than 15 tabs per month.).  Dispense: 15 tablet; Refill: 2  Dystonia, unspecified Assessment & Plan: Follows with ENT and neurologist. Currently on Artane 2 mg tid. Continue Valium 5 mg daily as needed.  Orders: -     diazePAM; Take 1 tablet (5 mg total) by mouth daily as needed for anxiety (No more than 15 tabs per month.).  Dispense: 15 tablet; Refill: 2  Thrombocytosis Mild. Further recommendations according to CBC results.  -     CBC with Differential/Platelet; Future  Need for shingles vaccine -     Varicella-zoster vaccine IM  Need for pneumococcal vaccination -  Pneumococcal conjugate vaccine 20-valent  Carpal tunnel syndrome of right wrist He is not interested in further work up for now. Recommend starting wearing wrist splint. If not better, sport med consultation will be recommended.  Return in 6 months (on 04/01/2024) for chronic problems.  I, Rolla Etienne Wierda, acting as a scribe for Temisha Murley [place], MD., have documented all relevant documentation on the behalf of Jumana Paccione [place], MD, as directed by  Vikkie Goeden [place], MD while in the presence of Chue Berkovich [place], MD.   I, Kiana Hollar [place], MD, have reviewed all documentation for this visit. The documentation on 10/03/23 for the exam, diagnosis, procedures, and orders are all accurate and complete.  Samba Cumba G. [place], MD  [facility]. [place] office.

## 2023-10-04 LAB — COMPREHENSIVE METABOLIC PANEL
ALT: 16 U/L (ref 0–53)
AST: 14 U/L (ref 0–37)
Albumin: 4 g/dL (ref 3.5–5.2)
Alkaline Phosphatase: 55 U/L (ref 39–117)
BUN: 12 mg/dL (ref 6–23)
CO2: 30 meq/L (ref 19–32)
Calcium: 9.2 mg/dL (ref 8.4–10.5)
Chloride: 102 meq/L (ref 96–112)
Creatinine, Ser: 0.87 mg/dL (ref 0.40–1.50)
GFR: 100.69 mL/min (ref >60.00)
Glucose, Bld: 92 mg/dL (ref 70–99)
Potassium: 3.7 meq/L (ref 3.5–5.1)
Sodium: 138 meq/L (ref 135–145)
Total Bilirubin: 0.6 mg/dL (ref 0.2–1.2)
Total Protein: 7.1 g/dL (ref 6.0–8.3)

## 2023-10-04 LAB — CBC WITH DIFFERENTIAL/PLATELET
Basophils Absolute: 0 10*3/uL (ref 0.0–0.1)
Basophils Relative: 0.8 % (ref 0.0–3.0)
Eosinophils Absolute: 0.1 10*3/uL (ref 0.0–0.7)
Eosinophils Relative: 2 % (ref 0.0–5.0)
HCT: 40.5 % (ref 39.0–52.0)
Hemoglobin: 13.9 g/dL (ref 13.0–17.0)
Lymphocytes Relative: 43 % (ref 12.0–46.0)
Lymphs Abs: 2.4 10*3/uL (ref 0.7–4.0)
MCHC: 34.2 g/dL (ref 30.0–36.0)
MCV: 93.2 fL (ref 78.0–100.0)
Monocytes Absolute: 0.4 10*3/uL (ref 0.1–1.0)
Monocytes Relative: 8 % (ref 3.0–12.0)
Neutro Abs: 2.5 10*3/uL (ref 1.4–7.7)
Neutrophils Relative %: 46.2 % (ref 43.0–77.0)
Platelets: 237 10*3/uL (ref 150.0–400.0)
RBC: 4.35 Mil/uL (ref 4.22–5.81)
RDW: 13.9 % (ref 11.5–15.5)
WBC: 5.5 10*3/uL (ref 4.0–10.5)

## 2023-10-04 LAB — LIPID PANEL
Cholesterol: 211 mg/dL — ABNORMAL HIGH (ref 0–200)
HDL: 65.4 mg/dL (ref >39.00)
LDL Cholesterol: 119 mg/dL — ABNORMAL HIGH (ref 0–99)
NonHDL: 145.25
Total CHOL/HDL Ratio: 3
Triglycerides: 129 mg/dL (ref 0.0–149.0)
VLDL: 25.8 mg/dL (ref 0.0–40.0)

## 2023-10-04 LAB — HEMOGLOBIN A1C: Hgb A1c MFr Bld: 5.8 % (ref 4.6–6.5)

## 2023-10-06 ENCOUNTER — Encounter: Payer: Self-pay | Admitting: Family Medicine

## 2023-10-06 NOTE — Assessment & Plan Note (Addendum)
 Follows with ENT and neurologist. Currently on Artane 2 mg tid. Continue Valium 5 mg daily as needed.

## 2023-10-18 DIAGNOSIS — R3914 Feeling of incomplete bladder emptying: Secondary | ICD-10-CM | POA: Diagnosis not present

## 2023-10-18 DIAGNOSIS — R8271 Bacteriuria: Secondary | ICD-10-CM | POA: Diagnosis not present

## 2023-10-18 DIAGNOSIS — N35011 Post-traumatic bulbous urethral stricture: Secondary | ICD-10-CM | POA: Diagnosis not present

## 2023-11-07 DIAGNOSIS — R3914 Feeling of incomplete bladder emptying: Secondary | ICD-10-CM | POA: Diagnosis not present

## 2023-11-07 DIAGNOSIS — N35011 Post-traumatic bulbous urethral stricture: Secondary | ICD-10-CM | POA: Diagnosis not present

## 2023-11-10 DIAGNOSIS — S63693A Other sprain of left middle finger, initial encounter: Secondary | ICD-10-CM | POA: Diagnosis not present

## 2023-11-11 DIAGNOSIS — S63693A Other sprain of left middle finger, initial encounter: Secondary | ICD-10-CM | POA: Diagnosis not present

## 2023-11-21 DIAGNOSIS — G245 Blepharospasm: Secondary | ICD-10-CM | POA: Diagnosis not present

## 2023-11-21 DIAGNOSIS — G249 Dystonia, unspecified: Secondary | ICD-10-CM | POA: Diagnosis not present

## 2023-11-21 DIAGNOSIS — M2651 Abnormal jaw closure: Secondary | ICD-10-CM | POA: Diagnosis not present

## 2023-11-21 DIAGNOSIS — G244 Idiopathic orofacial dystonia: Secondary | ICD-10-CM | POA: Diagnosis not present

## 2023-11-28 DIAGNOSIS — N35011 Post-traumatic bulbous urethral stricture: Secondary | ICD-10-CM | POA: Diagnosis not present

## 2023-12-20 DIAGNOSIS — R3914 Feeling of incomplete bladder emptying: Secondary | ICD-10-CM | POA: Diagnosis not present

## 2023-12-20 DIAGNOSIS — N35011 Post-traumatic bulbous urethral stricture: Secondary | ICD-10-CM | POA: Diagnosis not present

## 2023-12-20 DIAGNOSIS — R8271 Bacteriuria: Secondary | ICD-10-CM | POA: Diagnosis not present

## 2023-12-24 DIAGNOSIS — N35912 Unspecified bulbous urethral stricture, male: Secondary | ICD-10-CM | POA: Diagnosis not present

## 2024-01-30 DIAGNOSIS — N35912 Unspecified bulbous urethral stricture, male: Secondary | ICD-10-CM | POA: Diagnosis not present

## 2024-02-20 DIAGNOSIS — G244 Idiopathic orofacial dystonia: Secondary | ICD-10-CM | POA: Diagnosis not present

## 2024-02-20 DIAGNOSIS — M2651 Abnormal jaw closure: Secondary | ICD-10-CM | POA: Diagnosis not present

## 2024-02-20 DIAGNOSIS — G245 Blepharospasm: Secondary | ICD-10-CM | POA: Diagnosis not present

## 2024-03-07 DIAGNOSIS — N3289 Other specified disorders of bladder: Secondary | ICD-10-CM | POA: Diagnosis not present

## 2024-03-07 DIAGNOSIS — J45909 Unspecified asthma, uncomplicated: Secondary | ICD-10-CM | POA: Diagnosis not present

## 2024-03-07 DIAGNOSIS — Z880 Allergy status to penicillin: Secondary | ICD-10-CM | POA: Diagnosis not present

## 2024-03-07 DIAGNOSIS — R739 Hyperglycemia, unspecified: Secondary | ICD-10-CM | POA: Diagnosis not present

## 2024-03-07 DIAGNOSIS — G8918 Other acute postprocedural pain: Secondary | ICD-10-CM | POA: Diagnosis not present

## 2024-03-07 DIAGNOSIS — Z79899 Other long term (current) drug therapy: Secondary | ICD-10-CM | POA: Diagnosis not present

## 2024-03-07 DIAGNOSIS — G4733 Obstructive sleep apnea (adult) (pediatric): Secondary | ICD-10-CM | POA: Diagnosis not present

## 2024-03-07 DIAGNOSIS — N35912 Unspecified bulbous urethral stricture, male: Secondary | ICD-10-CM | POA: Diagnosis not present

## 2024-03-08 DIAGNOSIS — G8918 Other acute postprocedural pain: Secondary | ICD-10-CM | POA: Diagnosis not present

## 2024-03-08 DIAGNOSIS — R112 Nausea with vomiting, unspecified: Secondary | ICD-10-CM | POA: Diagnosis not present

## 2024-03-08 DIAGNOSIS — N35912 Unspecified bulbous urethral stricture, male: Secondary | ICD-10-CM | POA: Diagnosis not present

## 2024-03-21 ENCOUNTER — Ambulatory Visit: Payer: Self-pay

## 2024-03-21 ENCOUNTER — Encounter (HOSPITAL_BASED_OUTPATIENT_CLINIC_OR_DEPARTMENT_OTHER): Payer: Self-pay

## 2024-03-21 ENCOUNTER — Emergency Department (HOSPITAL_BASED_OUTPATIENT_CLINIC_OR_DEPARTMENT_OTHER)

## 2024-03-21 ENCOUNTER — Other Ambulatory Visit: Payer: Self-pay

## 2024-03-21 DIAGNOSIS — M79605 Pain in left leg: Secondary | ICD-10-CM | POA: Insufficient documentation

## 2024-03-21 DIAGNOSIS — Z5321 Procedure and treatment not carried out due to patient leaving prior to being seen by health care provider: Secondary | ICD-10-CM | POA: Diagnosis not present

## 2024-03-21 DIAGNOSIS — M7989 Other specified soft tissue disorders: Secondary | ICD-10-CM | POA: Diagnosis not present

## 2024-03-21 DIAGNOSIS — M79604 Pain in right leg: Secondary | ICD-10-CM | POA: Insufficient documentation

## 2024-03-21 LAB — CBC WITH DIFFERENTIAL/PLATELET
Abs Immature Granulocytes: 0.02 10*3/uL (ref 0.00–0.07)
Basophils Absolute: 0 10*3/uL (ref 0.0–0.1)
Basophils Relative: 0 %
Eosinophils Absolute: 0.2 10*3/uL (ref 0.0–0.5)
Eosinophils Relative: 2 %
HCT: 32.1 % — ABNORMAL LOW (ref 39.0–52.0)
Hemoglobin: 11 g/dL — ABNORMAL LOW (ref 13.0–17.0)
Immature Granulocytes: 0 %
Lymphocytes Relative: 22 %
Lymphs Abs: 2.1 10*3/uL (ref 0.7–4.0)
MCH: 31.6 pg (ref 26.0–34.0)
MCHC: 34.3 g/dL (ref 30.0–36.0)
MCV: 92.2 fL (ref 80.0–100.0)
Monocytes Absolute: 0.5 10*3/uL (ref 0.1–1.0)
Monocytes Relative: 5 %
Neutro Abs: 6.7 10*3/uL (ref 1.7–7.7)
Neutrophils Relative %: 71 %
Platelets: 306 10*3/uL (ref 150–400)
RBC: 3.48 MIL/uL — ABNORMAL LOW (ref 4.22–5.81)
RDW: 13.8 % (ref 11.5–15.5)
WBC: 9.4 10*3/uL (ref 4.0–10.5)
nRBC: 0 % (ref 0.0–0.2)

## 2024-03-21 LAB — BASIC METABOLIC PANEL WITH GFR
Anion gap: 13 (ref 5–15)
BUN: 15 mg/dL (ref 6–20)
CO2: 25 mmol/L (ref 22–32)
Calcium: 9.4 mg/dL (ref 8.9–10.3)
Chloride: 104 mmol/L (ref 98–111)
Creatinine, Ser: 0.81 mg/dL (ref 0.61–1.24)
GFR, Estimated: >60 mL/min (ref >60)
Glucose, Bld: 97 mg/dL (ref 70–99)
Potassium: 3.7 mmol/L (ref 3.5–5.1)
Sodium: 142 mmol/L (ref 135–145)

## 2024-03-21 LAB — PRO BRAIN NATRIURETIC PEPTIDE: Pro Brain Natriuretic Peptide: 111 pg/mL (ref <300.0)

## 2024-03-21 NOTE — ED Triage Notes (Signed)
 Pt had surgery x2 weeks ago. Pt reports increased swelling in legs and feet today. Pt sent to ED by PCP for possible blood clot.

## 2024-03-21 NOTE — Telephone Encounter (Signed)
 FYI Only or Action Required?: FYI only for provider.  Patient was last seen in primary care on 10/03/2023 by [place], Betty G, MD.  Called Nurse Triage reporting Leg Swelling.  Symptoms began today.  Interventions attempted: Nothing.  Symptoms are: gradually worsening.  Triage Disposition: See HCP Within 4 Hours (Or PCP Triage)  Patient/caregiver understands and will follow disposition?: Yes   Copied from CRM #[id number]. Topic: Clinical - Red Word Triage >> Mar 21, 2024  4:26 PM Chiquita SQUIBB wrote: Red Word that prompted transfer to Nurse Triage: Patient had surgery on August 1st for his urethra blockage and was taking tylenol  and Ibuprofen  after the surgery and then was breaking rash, so they had him stop taking these medications and now the patient has swelling around his ankles, legs, and testicles.  Reason for Disposition  SEVERE leg swelling (e.g., swelling extends above knee, entire leg is swollen, weeping fluid)  Answer Assessment - Initial Assessment Questions Patient says he had urethroplasty surgery and was taking Tylenol  and Ibuprofen  and it caused rashes. Spoke to surgeon office and was told to stop taking the tylenol  and ibuprofen . He says earlier today he noticed swelling from his thighs down to this ankles, including the ankles and it has been since stopping the ibupofen. Advised since he is post op and no history of swelling, no heart history that it's best to go to the ED for evaluation to rule out blood clots. Advised sometimes post operatively people develop blood clots and they don't know until other symptoms are exhibited and they are evaluated for that. He asked what are some symptoms, advised swelling post op, pain to the area off swelling, which he denies pain. Advised if there is a clot that it could break off and travel to the heart or lungs causing SOB and/or CP or could travel to the brain causing a stroke. Advised just to be sure it's best to go to the ED. He asks could  he be seen in the office today. Advised no available appointments this late in the day and ED would be the best choice. He says he will follow what I suggested. Advised to call on Monday to be seen if nothing is found at the ED. He verbalized understanding.   1. ONSET: When did the swelling start? (e.g., minutes, hours, days)     Earlier   2. LOCATION: What part of the leg is swollen?  Are both legs swollen or just one leg?     Both legs from thighs down to ankles  3. SEVERITY: How bad is the swelling? (e.g., localized; mild, moderate, severe)     7/10   4. REDNESS: Is there redness or signs of infection?     No  5. PAIN: Is the swelling painful to touch? If Yes, ask: How painful is it?   (Scale 1-10; mild, moderate or severe)     No  6. FEVER: Do you have a fever? If Yes, ask: What is it, how was it measured, and when did it start?      No  7. CAUSE: What do you think is causing the leg swelling?     Unknown, possibly since stopping the ibuprofen   8. MEDICAL HISTORY: Do you have a history of blood clots (e.g., DVT), cancer, heart failure, kidney disease, or liver failure?     No  9. RECURRENT SYMPTOM: Have you had leg swelling before? If Yes, ask: When was the last time? What happened that time?  Yes  10. OTHER SYMPTOMS: Do you have any other symptoms? (e.g., chest pain, difficulty breathing)    Testicular swelling from surgery  Protocols used: Leg Swelling and Edema-A-AH

## 2024-03-22 ENCOUNTER — Emergency Department (HOSPITAL_BASED_OUTPATIENT_CLINIC_OR_DEPARTMENT_OTHER)
Admission: EM | Admit: 2024-03-22 | Discharge: 2024-03-22 | Disposition: A | Discharge status: Against Medical Advice | Source: Ambulatory Visit | Attending: Emergency Medicine | Admitting: Emergency Medicine

## 2024-03-22 NOTE — ED Notes (Signed)
 Pt ask to speak to a nurse. Pt reports he is upset about the wait time and would like to be seen. Pt also complained about the waiting room being cold. This RN explained to the patient that we do apologize about the wait and understand his frustration.Pt reports he would like to leave. This RN advise patient to stay.Pt reports he is still leaving.

## 2024-03-24 NOTE — Telephone Encounter (Signed)
 Noted

## 2024-03-26 DIAGNOSIS — N35912 Unspecified bulbous urethral stricture, male: Secondary | ICD-10-CM | POA: Diagnosis not present

## 2024-04-02 ENCOUNTER — Encounter: Payer: Self-pay | Admitting: Family Medicine

## 2024-04-02 ENCOUNTER — Ambulatory Visit: Admitting: Family Medicine

## 2024-04-02 VITALS — BP 122/70 | HR 100 | Resp 16 | Ht 69.0 in | Wt 212.1 lb

## 2024-04-02 DIAGNOSIS — F419 Anxiety disorder, unspecified: Secondary | ICD-10-CM | POA: Diagnosis not present

## 2024-04-02 DIAGNOSIS — I8393 Asymptomatic varicose veins of bilateral lower extremities: Secondary | ICD-10-CM | POA: Insufficient documentation

## 2024-04-02 DIAGNOSIS — R600 Localized edema: Secondary | ICD-10-CM | POA: Diagnosis not present

## 2024-04-02 NOTE — Assessment & Plan Note (Addendum)
 Problem has been stable, he is no longer on citalopram , has not taken it for about 6 months. For now continue diazepam  5 mg daily as needed, which he reports hide taking a couple times per year. We can continue the CPE, before if needed.

## 2024-04-02 NOTE — Patient Instructions (Addendum)
 A few things to remember from today's visit:  Varicose veins of both lower extremities, unspecified whether complicated  Bilateral lower extremity edema  Anxiety disorder, unspecified type  Left leg spots seem bruises around varicose veins, spider veins.  If you need refills for medications you take chronically, please call your pharmacy. Do not use My Chart to request refills or for acute issues that need immediate attention. If you send a my chart message, it may take a few days to be addressed, specially if I am not in the office.  Please be sure medication list is accurate. If a new problem present, please set up appointment sooner than planned today.

## 2024-04-02 NOTE — Assessment & Plan Note (Signed)
 We discussed diagnosis, prognosis, treatment options. For now he is not interested in referral to vein specialist. Compression stockings recommended. Continue adequate skin care.

## 2024-04-02 NOTE — Progress Notes (Signed)
 ACUTE VISIT Chief Complaint  Patient presents with   leg spots    Had surgery about 4 weeks ago, has some spots on his leg since the surgery    HPI: [name] is a 53 y.o. male with a PMHx significant for HLD, anxiety, BPH, and dystonia, who is here today with above concerns. Noted problem about 2 weeks after surgical procedure.  On August 1st he underwent an Urethroplasty, from which he followed up 03/26/2024 with Urology and was told to schedule an appointment with his PCP to discuss his concerns regarding swelling and skin changes.  Says that two weeks following his procedure is when he first noticed the swelling of his legs from the thighs downward and discolored areas on the left LE.   Swelling has reportedly improved, and there have been no new appearances of discolored skin since initially noting them - he has not tried any OTC interventions for relief/resolution. Has not noted LE's pain or erythema.  Denies having had any chills/fever, numbness/tingling of legs, chest pain, difficulty breathing, or palpitations.   On a related note, he says that he has been urinating easier following the surgery, and is no longer taking Flomax .   He does have some varicosities which he notes there is a Fhx of in mother and grandmother.  Regarding his Anxiety, he says that he has not been taking his Celexa  20 mg since 08/2023. He takes Valium  5 mg as needed, which has not been frequently.   He says he is still taking Artane 2 mg for Dystonia. Follows with ENT and neurologist.  Review of Systems  Constitutional:  Negative for activity change, appetite change and fever.  HENT:  Negative for mouth sores, nosebleeds and sore throat.   Respiratory:  Negative for cough, shortness of breath and wheezing.   Gastrointestinal:  Negative for abdominal pain, nausea and vomiting.  Genitourinary:  Negative for decreased urine volume, dysuria and hematuria.  Skin:  Negative for rash.   Neurological:  Negative for syncope, weakness and headaches.  See other pertinent positives and negatives in HPI.  Current Outpatient Medications on File Prior to Visit  Medication Sig Dispense Refill   diazepam  (VALIUM ) 5 MG tablet Take 1 tablet (5 mg total) by mouth daily as needed for anxiety (No more than 15 tabs per month.). 15 tablet 2   HYDROcodone -acetaminophen  (NORCO) 5-325 MG tablet Take 1-2 tablets by mouth every 6 (six) hours as needed for moderate pain. 10 tablet 0   Multiple Vitamins-Minerals (CENTRUM ADULTS PO) Take by mouth daily.     tamsulosin  (FLOMAX ) 0.4 MG CAPS capsule TAKE 1 CAPSULE(0.4 MG) BY MOUTH DAILY 90 capsule 2   trihexyphenidyl (ARTANE) 2 MG tablet Take 2 mg by mouth 3 (three) times daily with meals. 3 times daily with meals     No current facility-administered medications on file prior to visit.    Past Medical History:  Diagnosis Date   Asthma    09-07-2022 per pt very mild,  does not have an inhaler.  last attack fall of 2023.  10/13/2022   BPH associated with nocturia    per pt  2-5 times nightly   GAD (generalized anxiety disorder)    Hematuria    Incomplete emptying of bladder    Mild obstructive sleep apnea 09/2017   study in epic 09-27-2017  (09-07-2022  per pt had used cpap for 90 days then told did need to use anymore)  recommendation's positioning, lose weight, if symptomatic oral  appliance or cpap   Oromandibular dystonia    neurologist--- dr brad boards [facility])  treated w/ botox injections and PT;   per pt of upper and lower jaw due to mask during covid,  hard to chew at times- has spasms   Urethral stricture    Urge incontinence of urine    Wears glasses    Allergies  Allergen Reactions   Penicillins Rash    Social History   Socioeconomic History   Marital status: Single    Spouse name: Not on file   Number of children: Not on file   Years of education: Not on file   Highest education level: Not on file  Occupational History    Not on file  Tobacco Use   Smoking status: Never   Smokeless tobacco: Never  Vaping Use   Vaping status: Never Used  Substance and Sexual Activity   Alcohol use: Yes    Alcohol/week: 1.0 standard drink of alcohol    Types: 1 Standard drinks or equivalent per week   Drug use: Never   Sexual activity: Not on file  Other Topics Concern   Not on file  Social History Narrative   Not on file   Social Drivers of Health   Financial Resource Strain: Not on file  Food Insecurity: Not on file  Transportation Needs: Not on file  Physical Activity: Not on file  Stress: Not on file  Social Connections: Unknown (12/19/2021)   [facility]   Social Network    Social Network: Not on file    Vitals:   04/02/24 1604  BP: 122/70  Pulse: 100  Resp: 16  SpO2: 97%   Body mass index is 31.33 kg/m.  Physical Exam Vitals and nursing note reviewed.  Constitutional:      General: He is not in acute distress.    Appearance: He is well-developed.  HENT:     Head: Normocephalic and atraumatic.  Eyes:     Conjunctiva/sclera: Conjunctivae normal.  Cardiovascular:     Rate and Rhythm: Normal rate and regular rhythm.     Pulses:          Dorsalis pedis pulses are 2+ on the right side and 2+ on the left side.     Heart sounds: No murmur heard.    Comments: Minor swelling of lower extremities Pulmonary:     Effort: Pulmonary effort is normal. No respiratory distress.     Breath sounds: Normal breath sounds.  Abdominal:     Palpations: Abdomen is soft. There is no hepatomegaly or mass.     Tenderness: There is no abdominal tenderness.  Skin:    General: Skin is warm.     Findings: Ecchymosis (A couple noted on distal LLE on area of confluent varicose veins.) present. No erythema or rash.  Neurological:     Mental Status: He is alert and oriented to person, place, and time.     Cranial Nerves: No cranial nerve deficit.     Gait: Gait normal.  Psychiatric:        Mood  and Affect: Mood and affect normal.   ASSESSMENT AND PLAN: [name] was seen here today for Post Surgical Skin Changes.   Varicose veins of both lower extremities, unspecified whether complicated Assessment & Plan: We discussed diagnosis, prognosis, treatment options. Spots he is reporting are small ecchymosis on areas of telangiectasias/varicose veins.  For now he is not interested in referral to vein specialist. Compression stockings  recommended. Continue adequate skin care.  Bilateral lower extremity edema Problem has resolved. Hx and examination do not suggest a serious process. I do not think further work up is needed at this time. Instructed about warning signs.  Anxiety disorder, unspecified type Assessment & Plan: Problem has been stable, he is no longer on citalopram , has not taken it for about 6 months. For now continue diazepam  5 mg daily as needed, which he reports hide taking a couple times per year. We can continue following during CPE, before if needed.  Return if symptoms worsen or fail to improve.  I,Emily Lagle,acting as a Neurosurgeon for Wayden Schwertner [place], MD.,have documented all relevant documentation on the behalf of Antero Derosia [place], MD,as directed by  Abdulhadi Stopa [place], MD while in the presence of Talana Slatten [place], MD.  I, Rhandi Despain [place], MD, have reviewed all documentation for this visit. The documentation on 04/03/24 for the exam, diagnosis, procedures, and orders are all accurate and complete.  Janay Canan G. [place], MD  [facility]. [place] office.

## 2024-04-23 DIAGNOSIS — N35912 Unspecified bulbous urethral stricture, male: Secondary | ICD-10-CM | POA: Diagnosis not present

## 2024-05-30 DIAGNOSIS — G244 Idiopathic orofacial dystonia: Secondary | ICD-10-CM | POA: Diagnosis not present

## 2024-05-30 DIAGNOSIS — G245 Blepharospasm: Secondary | ICD-10-CM | POA: Diagnosis not present

## 2024-06-03 DIAGNOSIS — G249 Dystonia, unspecified: Secondary | ICD-10-CM | POA: Diagnosis not present

## 2024-06-03 DIAGNOSIS — R471 Dysarthria and anarthria: Secondary | ICD-10-CM | POA: Diagnosis not present

## 2024-06-25 DIAGNOSIS — R471 Dysarthria and anarthria: Secondary | ICD-10-CM | POA: Diagnosis not present

## 2024-06-25 DIAGNOSIS — G249 Dystonia, unspecified: Secondary | ICD-10-CM | POA: Diagnosis not present

## 2024-07-09 DIAGNOSIS — G249 Dystonia, unspecified: Secondary | ICD-10-CM | POA: Diagnosis not present

## 2024-07-09 DIAGNOSIS — R471 Dysarthria and anarthria: Secondary | ICD-10-CM | POA: Diagnosis not present

## 2024-07-23 DIAGNOSIS — N35912 Unspecified bulbous urethral stricture, male: Secondary | ICD-10-CM | POA: Diagnosis not present

## 2024-07-23 DIAGNOSIS — N401 Enlarged prostate with lower urinary tract symptoms: Secondary | ICD-10-CM | POA: Diagnosis not present

## 2024-07-23 DIAGNOSIS — R351 Nocturia: Secondary | ICD-10-CM | POA: Diagnosis not present

## 2024-07-23 DIAGNOSIS — R471 Dysarthria and anarthria: Secondary | ICD-10-CM | POA: Diagnosis not present

## 2024-07-23 DIAGNOSIS — R6882 Decreased libido: Secondary | ICD-10-CM | POA: Diagnosis not present

## 2024-07-23 DIAGNOSIS — G249 Dystonia, unspecified: Secondary | ICD-10-CM | POA: Diagnosis not present
